# Patient Record
Sex: Female | Born: 1980 | Race: White | Hispanic: No | Marital: Single | Smoking: Never smoker
Health system: Southern US, Community
[De-identification: ages and names within clinical notes are randomized; demographics above are authoritative.]

## PROBLEM LIST (undated history)

## (undated) DIAGNOSIS — G43909 Migraine, unspecified, not intractable, without status migrainosus: Secondary | ICD-10-CM

## (undated) DIAGNOSIS — R87629 Unspecified abnormal cytological findings in specimens from vagina: Secondary | ICD-10-CM

## (undated) DIAGNOSIS — J302 Other seasonal allergic rhinitis: Secondary | ICD-10-CM

## (undated) DIAGNOSIS — M545 Low back pain, unspecified: Secondary | ICD-10-CM

## (undated) DIAGNOSIS — N159 Renal tubulo-interstitial disease, unspecified: Secondary | ICD-10-CM

## (undated) DIAGNOSIS — F1910 Other psychoactive substance abuse, uncomplicated: Secondary | ICD-10-CM

## (undated) DIAGNOSIS — R51 Headache: Secondary | ICD-10-CM

## (undated) HISTORY — PX: FOOT ARTHRODESIS, MODIFIED MCBRIDE: SUR52

## (undated) HISTORY — DX: Migraine, unspecified, not intractable, without status migrainosus: G43.909

## (undated) HISTORY — PX: OTHER SURGICAL HISTORY: SHX169

## (undated) HISTORY — DX: Other psychoactive substance abuse, uncomplicated: F19.10

## (undated) HISTORY — DX: Unspecified abnormal cytological findings in specimens from vagina: R87.629

---
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
## 2002-01-19 ENCOUNTER — Inpatient Hospital Stay (HOSPITAL_COMMUNITY): Admission: AD | Admit: 2002-01-19 | Discharge: 2002-01-19 | Payer: Self-pay | Admitting: Obstetrics and Gynecology

## 2002-01-24 ENCOUNTER — Inpatient Hospital Stay (HOSPITAL_COMMUNITY): Admission: AD | Admit: 2002-01-24 | Discharge: 2002-01-26 | Payer: Self-pay | Admitting: Obstetrics and Gynecology

## 2003-03-08 ENCOUNTER — Emergency Department (HOSPITAL_COMMUNITY): Admission: EM | Admit: 2003-03-08 | Discharge: 2003-03-08 | Payer: Self-pay | Admitting: Emergency Medicine

## 2004-02-20 DIAGNOSIS — N159 Renal tubulo-interstitial disease, unspecified: Secondary | ICD-10-CM

## 2004-02-20 HISTORY — DX: Renal tubulo-interstitial disease, unspecified: N15.9

## 2009-08-18 ENCOUNTER — Emergency Department (HOSPITAL_COMMUNITY): Admission: EM | Admit: 2009-08-18 | Discharge: 2009-08-18 | Payer: Self-pay | Admitting: Emergency Medicine

## 2010-02-19 NOTE — L&D Delivery Note (Signed)
Delivery Note At 2:52 AM a healthy female was delivered via  (Presentation: occiput anterior).  APGAR: 8, 9 ; weight: 8 lb 0 oz.  Placenta status: delivered intact.  Cord: 3 vessel with the following complications: Nuchal cord x1, delivered through  Anesthesia:  Epidural Episiotomy: None Lacerations: None requiring repair Est. Blood Loss (mL): <500 mL  Mom to postpartum.  Baby to nursery-stable.  Case supervised by Maylon Cos, CNM  Sandra Oliver 02/15/2011, 5:20 AM

## 2010-06-29 LAB — ABO/RH: RH Type: POSITIVE

## 2010-06-29 LAB — HEPATITIS B SURFACE ANTIGEN: Hepatitis B Surface Ag: NEGATIVE

## 2010-06-29 LAB — CBC
Hemoglobin: 13.4 g/dL (ref 12.0–16.0)
Platelets: 280 10*3/uL (ref 150–399)

## 2010-06-29 LAB — GC/CHLAMYDIA PROBE AMP, GENITAL: Chlamydia: NEGATIVE

## 2010-06-29 LAB — ANTIBODY SCREEN: Antibody Screen: NEGATIVE

## 2010-07-07 NOTE — Discharge Summary (Signed)
NAME:  ADARIA, HOLE                          ACCOUNT NO.:  [id number]   MEDICAL RECORD NO.:  [id number]                   PATIENT TYPE:  INP   LOCATION:  9102                                 FACILITY:  [facility]   PHYSICIAN:  Malachi Pro. Ambrose Mantle, M.D.              DATE OF BIRTH:  1980-03-03   DATE OF ADMISSION:  01/24/2002  DATE OF DISCHARGE:  01/26/2002                                 DISCHARGE SUMMARY   HISTORY OF PRESENT ILLNESS:  This is a 30 year old white female para 0,  gravida 1, last period March 31, 2001, [facility] January 05, 2002 by dates,  but December 3 by ultrasound admitted with rupture of membranes at 11:30  a.m.  Blood group and type O+ with a negative antibody.  Nonreactive  serology.  Rubella immune.  Hepatitis B surface antigen negative.  HIV  negative.  GC and Chlamydia negative.  Triple screen normal.  Group B Strep  negative.  Pap smear low grade SIL.  Vaginal ultrasound Jun 24, 2001:  Crown  rump length 2.48 cm, 9 weeks 1 day, [facility] January 26, 2002.  Colposcopy was  normal.  Repeat ultrasound average gestational age [redacted] weeks 5 days, [facility]  January 23, 2002.  The patient had a benign prenatal course.  She had  spontaneous rupture of membranes on the day of admission 11:30 a.m.   ALLERGIES:  No known allergies.   PAST MEDICAL HISTORY:  Usual childhood diseases plus urinary tract  infections.   OPERATIONS:  In 2000 she had a spur removed from her left foot.   FAMILY HISTORY:  Paternal grandmother with heart disease and diabetes.   PHYSICAL EXAMINATION:  VITAL SIGNS:  Blood pressure 140/77, pulse 82.  ABDOMEN:  Soft.  Fundal height 37 cm.  Fetal heart tones normal.  Contractions every four minutes.  PELVIC:  Cervix 3 cm, 80%, vertex at a -2.  There was clear fluid in the  vagina.   HOSPITAL COURSE:  The patient was begun on Pitocin and received an epidural  at approximately 7 p.m.  She began having variable decelerations with  contractions with good recovery.  By  9:12 p.m. the Pitocin was at 10  milliunits/minute.  Cervix 7 cm, 100%, vertex at a -1.  She became fully  dilated at 2241 hours.  Fetal heart rate was very sensitive to any position  change or any pushing efforts.  Pitocin was discontinued at 2152 hours  because of a three minute deceleration.  Oxygen was begun.  I asked the  patient to push once but the fetal heart rate decelerated markedly.  By  11:10 p.m. I wanted to observe to see if descent occurred without pushing.  The cervix was 10 cm and vertex at +1.  With the patient on her right side  she began to feel an urge to push and with pushing she brought the vertex to  the perineum.  Fetal heart rate remained acceptable.  The fetal heart rate  decelerations continued to occur occasionally so at 1 a.m. the epidural was  boosted.  Because the patient could not push the baby out, a KIWI vacuum  with the mushroom cup was applied with a silver dollar of caput showing.  With two contractions little progress was made.  A left mediolateral  episiotomy was cut and with the third contraction the living female infant 7  pounds 3 ounces with Apgars of 9 at one and 9 at five minutes was delivered  easily.  There was an occult cord prolapse.  There was mild shoulder  dystocia managed with McRoberts and wood screw maneuvers.  Placenta was  intact.  Uterus normal.  Rectal negative.  Left mediolateral episiotomy  repaired with 3-0 Vicryl.  Blood loss about 400 cc.  Postpartum the patient  did well and was discharged on the first postpartum day.  Hemoglobin on  admission 13.3, hematocrit 39.0, white count 10,400, platelet count 228,000.  Follow-up hemoglobin 10.9, hematocrit 32.5.  RPR was nonreactive.   FINAL DIAGNOSES:  1. Intrauterine pregnancy at 39+ weeks delivered vertex.  2. Prolonged second stage of labor.  3. Mild shoulder dystocia.   OPERATION:  1. Vacuum assisted vaginal delivery.  2. Left mediolateral episiotomy and repair.    CONDITION ON DISCHARGE:  Improved.   DISCHARGE INSTRUCTIONS:  Regular discharge instruction booklet.  Percocet  5/325 16 tablets one q.4-6h. as needed for pain.  The patient is advised to  return to the office in six weeks for follow-up examination.                                               Malachi Pro. Ambrose Mantle, M.D.    TFH/MEDQ  D:  01/26/2002  T:  01/26/2002  Job:  [id number]

## 2010-07-07 NOTE — Discharge Summary (Signed)
NAME:  Sandra Oliver, Sandra Oliver                          ACCOUNT NO.:  [id number]   MEDICAL RECORD NO.:  [id number]                   PATIENT TYPE:  INP   LOCATION:  9116                                 [facility]ILITY:  [facility]   PHYSICIAN:  Zenaida Niece, M.D.             DATE OF BIRTH:  02/07/1981   DATE OF ADMISSION:  09/13/2001  DATE OF DISCHARGE:  09/15/2001                                 DISCHARGE SUMMARY   ADMISSION DIAGNOSES:  1. Intrauterine pregnancy at 20 weeks.  2. Right lower quadrant pain.   DISCHARGE DIAGNOSES:  1. Intrauterine pregnancy at 20 weeks.  2. Right lower quadrant pain.   CONSULTATIONS:  Dr. Cyndia Bent and Dr. Shelle Iron.   PROCEDURES:  1. CT scan of the abdomen and pelvis.  2. CT scan of the chest.  3. Ultrasound of the pelvis and abdomen.  4. Lower extremity Dopplers.   HISTORY OF PRESENT ILLNESS:  This is an 30 year old white female gravida 1,  para 0 with an EGA of [redacted] weeks who was well until 14:00 on July 26.  She was  eating a chicken sandwich and french fries when she suddenly lost her  appetite.  She had no nausea or vomiting.  About one hour later she began  having right lateral midabdominal pain that progressed to the right upper  quadrant and also radiated to her right shoulder and right neck.  At  approximately 19:30 she ate some chips, and the pain moved to her lower  abdomen, but also involved the right upper quadrant, right shoulder, and  neck.  Again, no nausea, vomiting, or diarrhea.  She got lightheaded when  she stood up and decided to come to the hospital.  Past medical history:  Negative.  Past surgical history:  Foot surgery.  All remainder of her  history is negative.  Medications:  None.   PHYSICAL EXAMINATION:  VITAL SIGNS:  Temperature 99.4, pulse 89,  respirations 20, blood pressure 134/85, 128/73, 131/68, 124/54.  She was not  orthostatic.  HEENT:  Normal.  HEART:  Normal sinus rhythm without murmur.  CHEST:  Lungs clear to  auscultation and percussion.  ABDOMEN:  Some direct  tenderness in the right upper quadrant, left upper quadrant, and right lower  quadrant with slight rebound in the right lower quadrant with no CVA  tenderness.   HOSPITAL COURSE:  The patient was admitted to [facility] with a  diagnosis of intrauterine pregnancy at 20 weeks and abdominal pain  suspicious for appendicitis.  Dr. Cyndia Bent was consulted and advised  to admit her and observe her.  Admission labs revealed a white count of  13.4, hemoglobin 12.1, platelet count 228,000, normal chem panel, normal  amylase, normal lipase, normal urinalysis.  Dr. Jamey Ripa saw the patient on  July 27 and advised a CT scan.  The CT scan revealed no appendicitis and was  otherwise normal.  Pelvic ultrasound  was normal, as well as abdominal  ultrasound.  CT scan did reveal some right hydronephrosis, which is probably  just related to being pregnant.  Chest x-ray was also negative.   White blood cell count fell to 10.3 on July 27 and 6.9 on July 28.  Hemoglobin remained stable.  Platelet count remained stable.  On the morning  of July 28 she was still complaining of pain on the right side and right  neck when breathing deeply.  Her abdominal exam was unchanged.  Dr. Malachi Pro. Henley ordered a pulmonary consult.  Due to pleuritic quality of the  chest pain, a spiral CT scan of her chest was obtained, which did not reveal  a pulmonary embolism.  Lower extremity Dopplers were also normal.  On the  evening of July 28 her pain was stable, and she was felt to have an adequate  workup, which did not reveal an etiology for her pain.  However, at this  point there was felt to be no serious problem occurring, and she was felt to  be stable enough to be managed as an outpatient.   DISCHARGE INSTRUCTIONS:  Diet is regular diet.  Activity is as tolerated.  Medications are Darvocet-N 100 #20 one q.6h. p.r.n.  The patient is to call  for increase in  pain, decreased fetal movement, vaginal bleeding or gush of  fluid, and she is to followup in three to four days.                                                Zenaida Niece, M.D.    TDM/MEDQ  D:  09/15/2001  T:  09/22/2001  Job:  [id number]   cc:   Zenaida Niece, M.D.   Currie Paris, M.D.  Fax: [phone]   Barbaraann Share, M.D. [facility]

## 2010-07-07 NOTE — H&P (Signed)
[facility]  Patient:    Sandra Oliver, Sandra Oliver Visit Number: [id number] MRN: [id number]          Service Type: Attending:  Malachi Pro. Ambrose Mantle, M.D. Dictated by:   Malachi Pro. Ambrose Mantle, M.D. Adm. Date:  09/13/01   CC:         Currie Paris, M.D.   History and Physical  HISTORY OF PRESENT ILLNESS:   An 30 year old white single female para 0 gravida 1; last period March 2003; [facility] January 26, 2002.  The patient had ultrasounds at five to six weeks and at nine weeks to determine the [facility].  She was well until 2 p.m. on the day of admission.  She was eating a chicken sandwich and french fries when she lost her appetite.  She had no nausea or vomiting.  About one hour later she began having right lateral mid abdominal pain that progressed to the right upper quadrant, right shoulder, and right neck.  At approximately 7:30 p.m. she ate some chips and the pain moved to her lower abdomen but also involved her right upper quadrant, right shoulder, and neck.  She has had no nausea, vomiting, or diarrhea.  She had a last bowel movement at 4 p.m. on the day of admission.  She has not been constipated. She also reports that she got light-headed when she stood up.  PAST MEDICAL HISTORY:  ALLERGIES:                    No known allergies.  SURGICAL HISTORY:             Foot surgery in 2000.  MEDICAL HISTORY:              Usual childhood diseases.  FAMILY HISTORY:               Mother, 26, with asthma.  Father, 87, living and well.  One brother with bronchial asthma.  ALCOHOL, TOBACCO, AND DRUGS:  None.  SOCIAL HISTORY:               The patient is a high Garment/textile technologist.  She is presently working at [employer].  REVIEW OF SYSTEMS:            Negative.  PHYSICAL EXAMINATION ON ADMISSION:  VITAL SIGNS:                  Temperature 99.4, pulse 89, respirations 20, blood pressure 130/85.  Because of her history of getting light-headed when she stood up, her blood  pressure was taken lying down, sitting up, and standing up.  They were 128/73, 131/68, and 124/54 respectively.  Pulses were 88, 92, and 92.  HEENT:                        Revealed no cranial abnormalities.  NECK:                         Supple without thyromegaly.  HEART:                        Normal size and sounds, no murmurs.  LUNGS:                        Clear to P&A.  BREASTS:  Soft without masses.  ABDOMEN:                      Soft.  Fundal height at the umbilicus.  There was some direct tenderness in the right upper quadrant, left upper quadrant, and right lower quadrant.  There was slight rebound in the right lower quadrant.  There was no CVA tenderness.  PELVIC:                       Deferred on admission but repeated later and it showed a gravid uterus with no adnexal masses palpable.  DISPOSITION:                  Admitting impression was intrauterine pregnancy at 20 weeks with abdominal pain, possible appendicitis.  Dr. Jamey Ripa was consulted.  He advised admitting the patient and reexamining her in six to eight hours, which we did.  Her initial hemoglobin was 12.1; hematocrit 34.1; white count 13,400; platelet count 228,000.  Seven hours later her hemoglobin was 11.6; hematocrit 33.3; white count 10,300; platelet count 209,000. Comprehensive metabolic profile was normal except for a sodium of 134. Amylase was 39, lipase 22.  Urinalysis was completely negative.  Chest x-ray was normal.  The patient had an O2 saturation of 97-99 on room air.  ADMITTING IMPRESSION:         Intrauterine pregnancy at 20 weeks with abdominal pain.  The patient will be reexamined and possible CT scan on the day following admission.Dictated by:   Malachi Pro. Ambrose Mantle, M.D. Attending:  Malachi Pro. Ambrose Mantle, M.D. DD:  09/14/01 TD:  09/14/01 Job: [id number]

## 2010-11-14 ENCOUNTER — Encounter (HOSPITAL_COMMUNITY): Payer: Self-pay

## 2010-11-14 ENCOUNTER — Emergency Department (HOSPITAL_COMMUNITY)
Admission: EM | Admit: 2010-11-14 | Discharge: 2010-11-14 | Disposition: A | Discharge status: Home / Self Care | Payer: Medicaid Other | Attending: Emergency Medicine | Admitting: Emergency Medicine

## 2010-11-14 DIAGNOSIS — R109 Unspecified abdominal pain: Secondary | ICD-10-CM | POA: Insufficient documentation

## 2010-11-14 DIAGNOSIS — O99891 Other specified diseases and conditions complicating pregnancy: Secondary | ICD-10-CM | POA: Insufficient documentation

## 2010-11-14 LAB — URINE MICROSCOPIC-ADD ON

## 2010-11-14 LAB — URINALYSIS, ROUTINE W REFLEX MICROSCOPIC
Bilirubin Urine: NEGATIVE
Glucose, UA: NEGATIVE mg/dL
Hgb urine dipstick: NEGATIVE
Ketones, ur: NEGATIVE mg/dL
Nitrite: NEGATIVE
Protein, ur: NEGATIVE mg/dL
Specific Gravity, Urine: 1.019 (ref 1.005–1.030)
Urobilinogen, UA: 1 mg/dL (ref 0.0–1.0)
pH: 7 (ref 5.0–8.0)

## 2010-11-14 NOTE — Progress Notes (Signed)
Called received at 1035.  Arrived to [facility] ED Room 16 at 1049.  Pt resting in stretcher with no apparent distress.  Pt is a G3P2 with a reported EDC of 02/08/11.  GA 27+5.  NKDA.  NKFA.  No latex allergies.  Med hx: benign with the exception of a h/o of frequent UTIs, particularly during childhood and occasional migraines.  Pt reports that she was diagnosed with a left ovarian cyst during this pregnancy.  Surgical hx: Pt reports a bunionectomy in 2012.  Ob hx:  SVD x 2 without complications.  Pt reports fetus was dx with a "braibn cyst" at her anatomy scan on 09/07/10 that needed follow up at 28 weeks.  Pt unable to be more specific about US findings.  Meds:  PNV 1 tab PO daily.  Pt reports last dose 11/13/10 and Tylenol 650-1,000 mg PO PRN for HA.  Pt states last dose "a couple of weeks ago".  Pt presents to [facility] ED with c/o constant left sided lower abdominal pain since awakening on Saturday 11/11/10.  Pt reports the pain to be constant in nature and dull.  Pt states the constant dull pain becomes sharp with certain positions and movements but quickly subsides back to a dull ache.  Pt denies any abdominal cramping, tightening, or UCs.  Pt denies both LOF and vaginal bleeding.  Pt states she moved back to [place] in July.  She states she received [facility] in [place] up until late July 2012.  Pt states she has not been able to obtain [facility] since her relocation to [place].  Pt reports 20 week anatomy scan revealed a "brain cyst" on the fetus and a left ovarian cyst of unknown size.  Pt unable to elaborate upon US findings but states f/u was suggested for [redacted] weeks GA.  Difficulty tracing FHR d/t maternal habitus.  Doppler tones assessed x 3 min upon arrival.  FHTs 140-150 with no decelerations noted.  Fetal movement both palpable and audible with doppler.  Pt reports normal fetal movement.  Abdomen soft and nontender upon palpation with no UCs noted.  No evidence of LOF or vaginal bleeding noted upon RN assessment.  VSS.    Dr Juliene Pina  informed of pt's presence, status, admitting complaint, med hx, ob hx, FHTs, absence of UCs.

## 2010-11-14 NOTE — Progress Notes (Signed)
FHT reassuring and appropriate for GA.  No UCs noted.  EFM/toco dc'd at this time.

## 2010-11-14 NOTE — Progress Notes (Signed)
Results for Sandra Oliver, HOH (MRN [id number]) as of 11/14/2010 11:55  Ref. Range 11/14/2010 10:27  Color, Urine Latest Range: YELLOW  YELLOW  Appearance Latest Range: CLEAR  CLOUDY (A)  Specific Gravity, Urine Latest Range: 1.005-1.030  1.019  pH Latest Range: 5.0-8.0  7.0  Glucose, UA Latest Range: NEGATIVE mg/dL NEGATIVE  Bilirubin Urine Latest Range: NEGATIVE  NEGATIVE  Ketones, ur Latest Range: NEGATIVE mg/dL NEGATIVE  Protein Latest Range: NEGATIVE mg/dL NEGATIVE  Urobilinogen, UA Latest Range: 0.0-1.0 mg/dL 1.0  Nitrite Latest Range: NEGATIVE  NEGATIVE  Leukocytes, UA Latest Range: NEGATIVE  TRACE (A)  Urine-Other No range found MUCOUS PRESENT  WBC, UA Latest Range: <3 WBC/hpf 0-2  RBC / HPF Latest Range: <3 RBC/hpf 0-2  Squamous Epithelial / LPF Latest Range: RARE  FEW (A)  Bacteria, UA Latest Range: RARE  RARE  Casts Latest Range: NEGATIVE  GRANULAR CAST (A)  Hgb urine dipstick Latest Range: NEGATIVE  NEGATIVE

## 2010-11-14 NOTE — Progress Notes (Signed)
EFM successfuly applied and tracing after several minutes of continual RN presence at [facility], attempting to obtain tracing.  FHTs 150's.  Will continue to assess.

## 2010-11-14 NOTE — Progress Notes (Signed)
Dr Juliene Pina updated re pt status, FHT, UA results.  Pt cleared from OB standpoint by Dr Juliene Pina at this time.  Pt given information regarding obtaining PNC and its importance to include a list of OBGYN providers and numbers and Women's clinic info at [facility].  Plan of care discussed with pt at this time.  Pt's questions answered in full and to her verbalized understanding.  Pt verbalizes understanding of and agreement with her care plan and intent to seek [facility].   PTL precautions reviewed with pt at this time.  Pt encouraged to go to [facility] to be evaluated should she experience any s/s of PTL, abdominal cramping, uterine contractions, vaginal bleeding, any leaking of fluid, or decreased fetal movement.  Pt verbalizes understanding of and agreement with both her dc instructions and her plan of care.

## 2010-11-15 LAB — URINE CULTURE: Culture: NO GROWTH

## 2010-12-06 ENCOUNTER — Other Ambulatory Visit: Payer: Self-pay | Admitting: Obstetrics and Gynecology

## 2010-12-06 ENCOUNTER — Ambulatory Visit (INDEPENDENT_AMBULATORY_CARE_PROVIDER_SITE_OTHER): Payer: Self-pay | Admitting: Obstetrics and Gynecology

## 2010-12-06 VITALS — BP 113/69 | Temp 97.8°F | Ht 62.5 in | Wt 200.8 lb

## 2010-12-06 DIAGNOSIS — R81 Glycosuria: Secondary | ICD-10-CM | POA: Insufficient documentation

## 2010-12-06 DIAGNOSIS — Z3480 Encounter for supervision of other normal pregnancy, unspecified trimester: Secondary | ICD-10-CM | POA: Insufficient documentation

## 2010-12-06 DIAGNOSIS — IMO0002 Reserved for concepts with insufficient information to code with codable children: Secondary | ICD-10-CM

## 2010-12-06 DIAGNOSIS — Z23 Encounter for immunization: Secondary | ICD-10-CM

## 2010-12-06 DIAGNOSIS — O350XX0 Maternal care for (suspected) central nervous system malformation in fetus, not applicable or unspecified: Secondary | ICD-10-CM

## 2010-12-06 DIAGNOSIS — Z3490 Encounter for supervision of normal pregnancy, unspecified, unspecified trimester: Secondary | ICD-10-CM

## 2010-12-06 DIAGNOSIS — O99330 Smoking (tobacco) complicating pregnancy, unspecified trimester: Secondary | ICD-10-CM | POA: Insufficient documentation

## 2010-12-06 DIAGNOSIS — R87619 Unspecified abnormal cytological findings in specimens from cervix uteri: Secondary | ICD-10-CM

## 2010-12-06 LAB — POCT URINALYSIS DIP (DEVICE)
Glucose, UA: 100 mg/dL — AB
Ketones, ur: NEGATIVE mg/dL
Leukocytes, UA: NEGATIVE
Protein, ur: NEGATIVE mg/dL
Specific Gravity, Urine: >=1.03 (ref 1.005–1.030)

## 2010-12-06 LAB — RPR

## 2010-12-06 MED ORDER — INFLUENZA VIRUS VACC SPLIT PF IM SUSP: 0.5000 mL | Freq: Once | INTRAMUSCULAR | Status: DC

## 2010-12-06 NOTE — Progress Notes (Signed)
S: 30 yo G3P2002 at 31.1wk had limited PNC in [facility] notable for [facility] on [place] which she is concerned about, abnl Pap for which plan was colpo PP, smoker. Hx neg for GDM. Seen [facility] 11/14/10 for abd pain which has resolved. Reviewed hx from today. Outside records pending.   O: glucosuria 100 and S>D. 1 hr glucola in progress. A:P: Discussed smoking cessation, F/U US for ? Hx CPC and S>D. GC/CT RPR done. Flu shot today.

## 2010-12-06 NOTE — Patient Instructions (Signed)
Pregnancy - Third Trimester The third trimester of pregnancy (the last 3 months) is a period of the most rapid growth for you and your baby. The baby approaches a length of 20 inches and a weight of 6 to 10 pounds. The baby is adding on fat and getting ready for life outside your body. While inside, babies have periods of sleeping and waking, suck their thumbs, and hiccups. You can often feel small contractions of the uterus. This is false labor. It is also called Braxton-Hicks contractions. This is like a practice for labor. The usual problems in this stage of pregnancy include more difficulty breathing, swelling of the hands and feet from water retention, and having to urinate more often because of the uterus and baby pressing on your bladder.  PRENATAL EXAMS  Blood work may continue to be done during prenatal exams. These tests are done to check on your health and the probable health of your baby. Blood work is used to follow your blood levels (hemoglobin). Anemia (low hemoglobin) is common during pregnancy. Iron and vitamins are given to help prevent this. You may also continue to be checked for diabetes. Some of the past blood tests may be done again.   The size of the uterus is measured during each visit. This makes sure your baby is growing properly according to your pregnancy dates.   Your blood pressure is checked every prenatal visit. This is to make sure you are not getting toxemia.   Your urine is checked every prenatal visit for infection, diabetes and protein.   Your weight is checked at each visit. This is done to make sure gains are happening at the suggested rate and that you and your baby are growing normally.   Sometimes, an ultrasound is performed to confirm the position and the proper growth and development of the baby. This is a test done that bounces harmless sound waves off the baby so your caregiver can more accurately determine due dates.   Discuss the type of pain  medication and anesthesia you will have during your labor and delivery.   Discuss the possibility and anesthesia if a Cesarean Section might be necessary.   Inform your caregiver if there is any mental or physical violence at home.  Sometimes, a specialized non-stress test, contraction stress test and biophysical profile are done to make sure the baby is not having a problem. Checking the amniotic fluid surrounding the baby is called an amniocentesis. The amniotic fluid is removed by sticking a needle into the belly (abdomen). This is sometimes done near the end of pregnancy if an early delivery is required. In this case, it is done to help make sure the baby's lungs are mature enough for the baby to live outside of the womb. If the lungs are not mature and it is unsafe to deliver the baby, an injection of cortisone medication is given to the mother 1 to 2 days before the delivery. This helps the baby's lungs mature and makes it safer to deliver the baby. CHANGES OCCURING IN THE THIRD TRIMESTER OF PREGNANCY Your body goes through many changes during pregnancy. They vary from person to person. Talk to your caregiver about changes you notice and are concerned about.  During the last trimester, you have probably had an increase in your appetite. It is normal to have cravings for certain foods. This varies from person to person and pregnancy to pregnancy.   You may begin to get stretch marks on your hips,   abdomen, and breasts. These are normal changes in the body during pregnancy. There are no exercises or medications to take which prevent this change.   Constipation may be treated with a stool softener or adding bulk to your diet. Drinking lots of fluids, fiber in vegetables, fruits, and whole grains are helpful.   Exercising is also helpful. If you have been very active up until your pregnancy, most of these activities can be continued during your pregnancy. If you have been less active, it is helpful  to start an exercise program such as walking. Consult your caregiver before starting exercise programs.   Avoid all smoking, alcohol, un-prescribed drugs, herbs and "street drugs" during your pregnancy. These chemicals affect the formation and growth of the baby. Avoid chemicals throughout the pregnancy to ensure the delivery of a healthy infant.   Backache, varicose veins and hemorrhoids may develop or get worse.   You will tire more easily in the third trimester, which is normal.   The baby's movements may be stronger and more often.   You may become short of breath easily.   Your belly button may stick out.   A yellow discharge may leak from your breasts called colostrum.   You may have a bloody mucus discharge. This usually occurs a few days to a week before labor begins.  HOME CARE INSTRUCTIONS  Keep your caregiver's appointments. Follow your caregiver's instructions regarding medication use, exercise, and diet.   During pregnancy, you are providing food for you and your baby. Continue to eat regular, well-balanced meals. Choose foods such as meat, fish, milk and other low fat dairy products, vegetables, fruits, and whole-grain breads and cereals. Your caregiver will tell you of the ideal weight gain.   A physical sexual relationship may be continued throughout pregnancy if there are no other problems such as early (premature) leaking of amniotic fluid from the membranes, vaginal bleeding, or belly (abdominal) pain.   Exercise regularly if there are no restrictions. Check with your caregiver if you are unsure of the safety of your exercises. Greater weight gain will occur in the last 2 trimesters of pregnancy. Exercising helps:   Control your weight.   Get you in shape for labor and delivery.   You lose weight after you deliver.   Rest a lot with legs elevated, or as needed for leg cramps or low back pain.   Wear a good support or jogging bra for breast tenderness during  pregnancy. This may help if worn during sleep. Pads or tissues may be used in the bra if you are leaking colostrum.   Do not use hot tubs, steam rooms, or saunas.   Wear your seat belt when driving. This protects you and your baby if you are in an accident.   Avoid raw meat, cat litter boxes and soil used by cats. These carry germs that can cause birth defects in the baby.   It is easier to loose urine during pregnancy. Tightening up and strengthening the pelvic muscles will help with this problem. You can practice stopping your urination while you are going to the bathroom. These are the same muscles you need to strengthen. It is also the muscles you would use if you were trying to stop from passing gas. You can practice tightening these muscles up 10 times a set and repeating this about 3 times per day. Once you know what muscles to tighten up, do not perform these exercises during urination. It is more likely to   cause an infection by backing up the urine.   Ask for help if you have financial, counseling or nutritional needs during pregnancy. Your caregiver will be able to offer counseling for these needs as well as refer you for other special needs.   Make a list of emergency phone numbers and have them available.   Plan on getting help from family or friends when you go home from the hospital.   Make a trial run to the hospital.   Take prenatal classes with the father to understand, practice and ask questions about the labor and delivery.   Prepare the baby's room/nursery.   Do not travel out of the city unless it is absolutely necessary and with the advice of your caregiver.   Wear only low or no heal shoes to have better balance and prevent falling.  MEDICATIONS AND DRUG USE IN PREGNANCY  Take prenatal vitamins as directed. The vitamin should contain 1 milligram of folic acid. Keep all vitamins out of reach of children. Only a couple vitamins or tablets containing iron may be fatal  to a baby or young child when ingested.   Avoid use of all medications, including herbs, over-the-counter medications, not prescribed or suggested by your caregiver. Only take over-the-counter or prescription medicines for pain, discomfort, or fever as directed by your caregiver. Do not use aspirin, ibuprofen (Motrin, Advil, Nuprin) or naproxen (Aleve) unless OK'd by your caregiver.   Let your caregiver also know about herbs you may be using.   Alcohol is related to a number of birth defects. This includes fetal alcohol syndrome. All alcohol, in any form, should be avoided completely. Smoking will cause low birth rate and premature babies.   Street/illegal drugs are very harmful to the baby. They are absolutely forbidden. A baby born to an addicted mother will be addicted at birth. The baby will go through the same withdrawal an adult does.  SEEK MEDICAL CARE IF  You have any concerns or worries during your pregnancy. It is better to call with your questions if you feel they cannot wait, rather than worry about them.  DECISIONS ABOUT CIRCUMCISION You may or may not know the sex of your baby. If you know your baby is a boy, it may be time to think about circumcision. Circumcision is the removal of the foreskin of the penis. This is the skin that covers the sensitive end of the penis. There is no proven medical need for this. Often this decision is made on what is popular at the time or based upon religious beliefs and social issues. You can discuss these issues with your caregiver or pediatrician. SEEK IMMEDIATE MEDICAL CARE IF:  An unexplained oral temperature above 100 develops, or as your caregiver suggests.   You have leaking of fluid from the vagina (birth canal). If leaking membranes are suspected, take your temperature and tell your caregiver of this when you call.   There is vaginal spotting, bleeding or passing clots. Tell your caregiver of the amount and how many pads are used.    You develop a bad smelling vaginal discharge with a change in the color from clear to white.   You develop vomiting that lasts more than 24 hours.   You develop chills or fever.   You develop shortness of breath.   You develop burning on urination.   You loose more than 2 pounds of weight or gain more than 2 pounds of weight or as suggested by your caregiver.     You notice sudden swelling of your face, hands, and feet or legs.   You develop belly (abdominal) pain. Round ligament discomfort is a common non-cancerous (benign) cause of abdominal pain in pregnancy. Your caregiver still must evaluate you.   You develop a severe headache that does not go away.   You develop visual problems, blurred or double vision.   If you have not felt your baby move for more than 1 hour. If you think the baby is not moving as much as usual, eat something with sugar in it and lie down on your left side for an hour. The baby should move at least 4 to 5 times per hour. Call right away if your baby moves less than that.   You fall, are in a car accident or any kind of trauma.   There is mental or physical violence at home.  Document Released: 01/30/2001 Document Re-Released: 12/03/2008 [employer] Patient Information 2011 [employer]. 

## 2010-12-06 NOTE — Progress Notes (Signed)
Edema: Right Leg

## 2010-12-07 ENCOUNTER — Ambulatory Visit (HOSPITAL_COMMUNITY)
Admission: RE | Admit: 2010-12-07 | Discharge: 2010-12-07 | Disposition: A | Discharge status: Home / Self Care | Payer: Medicaid Other | Source: Ambulatory Visit | Attending: Obstetrics and Gynecology | Admitting: Obstetrics and Gynecology

## 2010-12-07 ENCOUNTER — Other Ambulatory Visit: Payer: Self-pay | Admitting: Obstetrics and Gynecology

## 2010-12-07 DIAGNOSIS — O358XX0 Maternal care for other (suspected) fetal abnormality and damage, not applicable or unspecified: Secondary | ICD-10-CM | POA: Insufficient documentation

## 2010-12-07 DIAGNOSIS — IMO0002 Reserved for concepts with insufficient information to code with codable children: Secondary | ICD-10-CM

## 2010-12-07 DIAGNOSIS — Z1389 Encounter for screening for other disorder: Secondary | ICD-10-CM | POA: Insufficient documentation

## 2010-12-07 DIAGNOSIS — Z363 Encounter for antenatal screening for malformations: Secondary | ICD-10-CM | POA: Insufficient documentation

## 2010-12-07 LAB — CULTURE, OB URINE
Colony Count: NO GROWTH
Organism ID, Bacteria: NO GROWTH

## 2010-12-07 LAB — US OB COMP LESS 14 WKS

## 2010-12-07 LAB — GC/CHLAMYDIA PROBE AMP, GENITAL: Chlamydia, DNA Probe: NEGATIVE

## 2010-12-20 ENCOUNTER — Encounter: Payer: Self-pay | Admitting: Obstetrics and Gynecology

## 2010-12-20 ENCOUNTER — Ambulatory Visit (INDEPENDENT_AMBULATORY_CARE_PROVIDER_SITE_OTHER): Payer: Self-pay | Admitting: Obstetrics and Gynecology

## 2010-12-20 DIAGNOSIS — Z23 Encounter for immunization: Secondary | ICD-10-CM

## 2010-12-20 DIAGNOSIS — Z3480 Encounter for supervision of other normal pregnancy, unspecified trimester: Secondary | ICD-10-CM

## 2010-12-20 LAB — POCT URINALYSIS DIP (DEVICE)
Bilirubin Urine: NEGATIVE
Ketones, ur: NEGATIVE mg/dL
Nitrite: NEGATIVE
Protein, ur: NEGATIVE mg/dL
pH: 7 (ref 5.0–8.0)

## 2010-12-20 MED ORDER — TETANUS-DIPHTH-ACELL PERTUSSIS 5-2.5-18.5 LF-MCG/0.5 IM SUSP
0.5000 mL | Freq: Once | INTRAMUSCULAR | Status: AC
  Administered 2010-12-20: 0.5 mL via INTRAMUSCULAR

## 2010-12-20 NOTE — Patient Instructions (Signed)

## 2010-12-20 NOTE — Progress Notes (Signed)
Swelling in right leg only. Pelvic pressure and back pain since Sunday when walking. No vaginal discharge. Pulse 100. Pt signed consent for Tdap.

## 2010-12-20 NOTE — Progress Notes (Signed)
S; Rt LE swells more than left, noticeable when awakening, esp rt ankle. No focal pain or redness. This has been ongoing for weeks and was similar in prior pregnancy during which she had a neg Doppler study of that leg. Some groin and LBP with movement and walking. No UTI sx. O: LE: no varicosities or redness. 1-2+ ankle edema appears symmetrical at present. No calf tenderness.  [place]: 12/07/10: CPC 4mm on rt. 50th %ile growth with short femur, AFI 15. 1 hr glucola 96. Urine dip with lg LE and tr blood A:P: tdap today. Urine C&S and advised increase fluids. Discussed RLP. Informed of lab/US results.  AP: Small CPC.

## 2010-12-20 NOTE — Progress Notes (Signed)
Addended by: Toula Moos on: 12/20/2010 10:34 AM   Modules accepted: Orders

## 2011-01-03 ENCOUNTER — Ambulatory Visit (INDEPENDENT_AMBULATORY_CARE_PROVIDER_SITE_OTHER): Payer: Medicaid Other | Admitting: Advanced Practice Midwife

## 2011-01-03 ENCOUNTER — Other Ambulatory Visit: Payer: Self-pay

## 2011-01-03 VITALS — BP 116/68 | Temp 98.1°F | Wt 205.4 lb

## 2011-01-03 DIAGNOSIS — O99330 Smoking (tobacco) complicating pregnancy, unspecified trimester: Secondary | ICD-10-CM

## 2011-01-03 DIAGNOSIS — Z3490 Encounter for supervision of normal pregnancy, unspecified, unspecified trimester: Secondary | ICD-10-CM

## 2011-01-03 DIAGNOSIS — O09899 Supervision of other high risk pregnancies, unspecified trimester: Secondary | ICD-10-CM

## 2011-01-03 DIAGNOSIS — Z3480 Encounter for supervision of other normal pregnancy, unspecified trimester: Secondary | ICD-10-CM

## 2011-01-03 LAB — POCT URINALYSIS DIP (DEVICE)
Ketones, ur: NEGATIVE mg/dL
Protein, ur: NEGATIVE mg/dL
Specific Gravity, Urine: 1.025 (ref 1.005–1.030)
Urobilinogen, UA: 0.2 mg/dL (ref 0.0–1.0)
pH: 6 (ref 5.0–8.0)

## 2011-01-03 NOTE — Patient Instructions (Signed)
Pregnancy - Third Trimester The third trimester of pregnancy (the last 3 months) is a period of the most rapid growth for you and your baby. The baby approaches a length of 20 inches and a weight of 6 to 10 pounds. The baby is adding on fat and getting ready for life outside your body. While inside, babies have periods of sleeping and waking, suck their thumbs, and hiccups. You can often feel small contractions of the uterus. This is false labor. It is also called Braxton-Hicks contractions. This is like a practice for labor. The usual problems in this stage of pregnancy include more difficulty breathing, swelling of the hands and feet from water retention, and having to urinate more often because of the uterus and baby pressing on your bladder.  PRENATAL EXAMS  Blood work may continue to be done during prenatal exams. These tests are done to check on your health and the probable health of your baby. Blood work is used to follow your blood levels (hemoglobin). Anemia (low hemoglobin) is common during pregnancy. Iron and vitamins are given to help prevent this. You may also continue to be checked for diabetes. Some of the past blood tests may be done again.   The size of the uterus is measured during each visit. This makes sure your baby is growing properly according to your pregnancy dates.   Your blood pressure is checked every prenatal visit. This is to make sure you are not getting toxemia.   Your urine is checked every prenatal visit for infection, diabetes and protein.   Your weight is checked at each visit. This is done to make sure gains are happening at the suggested rate and that you and your baby are growing normally.   Sometimes, an ultrasound is performed to confirm the position and the proper growth and development of the baby. This is a test done that bounces harmless sound waves off the baby so your caregiver can more accurately determine due dates.   Discuss the type of pain  medication and anesthesia you will have during your labor and delivery.   Discuss the possibility and anesthesia if a Cesarean Section might be necessary.   Inform your caregiver if there is any mental or physical violence at home.  Sometimes, a specialized non-stress test, contraction stress test and biophysical profile are done to make sure the baby is not having a problem. Checking the amniotic fluid surrounding the baby is called an amniocentesis. The amniotic fluid is removed by sticking a needle into the belly (abdomen). This is sometimes done near the end of pregnancy if an early delivery is required. In this case, it is done to help make sure the baby's lungs are mature enough for the baby to live outside of the womb. If the lungs are not mature and it is unsafe to deliver the baby, an injection of cortisone medication is given to the mother 1 to 2 days before the delivery. This helps the baby's lungs mature and makes it safer to deliver the baby. CHANGES OCCURING IN THE THIRD TRIMESTER OF PREGNANCY Your body goes through many changes during pregnancy. They vary from person to person. Talk to your caregiver about changes you notice and are concerned about.  During the last trimester, you have probably had an increase in your appetite. It is normal to have cravings for certain foods. This varies from person to person and pregnancy to pregnancy.   You may begin to get stretch marks on your hips,   abdomen, and breasts. These are normal changes in the body during pregnancy. There are no exercises or medications to take which prevent this change.   Constipation may be treated with a stool softener or adding bulk to your diet. Drinking lots of fluids, fiber in vegetables, fruits, and whole grains are helpful.   Exercising is also helpful. If you have been very active up until your pregnancy, most of these activities can be continued during your pregnancy. If you have been less active, it is helpful  to start an exercise program such as walking. Consult your caregiver before starting exercise programs.   Avoid all smoking, alcohol, un-prescribed drugs, herbs and "street drugs" during your pregnancy. These chemicals affect the formation and growth of the baby. Avoid chemicals throughout the pregnancy to ensure the delivery of a healthy infant.   Backache, varicose veins and hemorrhoids may develop or get worse.   You will tire more easily in the third trimester, which is normal.   The baby's movements may be stronger and more often.   You may become short of breath easily.   Your belly button may stick out.   A yellow discharge may leak from your breasts called colostrum.   You may have a bloody mucus discharge. This usually occurs a few days to a week before labor begins.  HOME CARE INSTRUCTIONS   Keep your caregiver's appointments. Follow your caregiver's instructions regarding medication use, exercise, and diet.   During pregnancy, you are providing food for you and your baby. Continue to eat regular, well-balanced meals. Choose foods such as meat, fish, milk and other low fat dairy products, vegetables, fruits, and whole-grain breads and cereals. Your caregiver will tell you of the ideal weight gain.   A physical sexual relationship may be continued throughout pregnancy if there are no other problems such as early (premature) leaking of amniotic fluid from the membranes, vaginal bleeding, or belly (abdominal) pain.   Exercise regularly if there are no restrictions. Check with your caregiver if you are unsure of the safety of your exercises. Greater weight gain will occur in the last 2 trimesters of pregnancy. Exercising helps:   Control your weight.   Get you in shape for labor and delivery.   You lose weight after you deliver.   Rest a lot with legs elevated, or as needed for leg cramps or low back pain.   Wear a good support or jogging bra for breast tenderness during  pregnancy. This may help if worn during sleep. Pads or tissues may be used in the bra if you are leaking colostrum.   Do not use hot tubs, steam rooms, or saunas.   Wear your seat belt when driving. This protects you and your baby if you are in an accident.   Avoid raw meat, cat litter boxes and soil used by cats. These carry germs that can cause birth defects in the baby.   It is easier to loose urine during pregnancy. Tightening up and strengthening the pelvic muscles will help with this problem. You can practice stopping your urination while you are going to the bathroom. These are the same muscles you need to strengthen. It is also the muscles you would use if you were trying to stop from passing gas. You can practice tightening these muscles up 10 times a set and repeating this about 3 times per day. Once you know what muscles to tighten up, do not perform these exercises during urination. It is more likely   to cause an infection by backing up the urine.   Ask for help if you have financial, counseling or nutritional needs during pregnancy. Your caregiver will be able to offer counseling for these needs as well as refer you for other special needs.   Make a list of emergency phone numbers and have them available.   Plan on getting help from family or friends when you go home from the hospital.   Make a trial run to the hospital.   Take prenatal classes with the father to understand, practice and ask questions about the labor and delivery.   Prepare the baby's room/nursery.   Do not travel out of the city unless it is absolutely necessary and with the advice of your caregiver.   Wear only low or no heal shoes to have better balance and prevent falling.  MEDICATIONS AND DRUG USE IN PREGNANCY  Take prenatal vitamins as directed. The vitamin should contain 1 milligram of folic acid. Keep all vitamins out of reach of children. Only a couple vitamins or tablets containing iron may be fatal  to a baby or young child when ingested.   Avoid use of all medications, including herbs, over-the-counter medications, not prescribed or suggested by your caregiver. Only take over-the-counter or prescription medicines for pain, discomfort, or fever as directed by your caregiver. Do not use aspirin, ibuprofen (Motrin, Advil, Nuprin) or naproxen (Aleve) unless OK'd by your caregiver.   Let your caregiver also know about herbs you may be using.   Alcohol is related to a number of birth defects. This includes fetal alcohol syndrome. All alcohol, in any form, should be avoided completely. Smoking will cause low birth rate and premature babies.   Street/illegal drugs are very harmful to the baby. They are absolutely forbidden. A baby born to an addicted mother will be addicted at birth. The baby will go through the same withdrawal an adult does.  SEEK MEDICAL CARE IF: You have any concerns or worries during your pregnancy. It is better to call with your questions if you feel they cannot wait, rather than worry about them. DECISIONS ABOUT CIRCUMCISION You may or may not know the sex of your baby. If you know your baby is a boy, it may be time to think about circumcision. Circumcision is the removal of the foreskin of the penis. This is the skin that covers the sensitive end of the penis. There is no proven medical need for this. Often this decision is made on what is popular at the time or based upon religious beliefs and social issues. You can discuss these issues with your caregiver or pediatrician. SEEK IMMEDIATE MEDICAL CARE IF:   An unexplained oral temperature above 102 F (38.9 C) develops, or as your caregiver suggests.   You have leaking of fluid from the vagina (birth canal). If leaking membranes are suspected, take your temperature and tell your caregiver of this when you call.   There is vaginal spotting, bleeding or passing clots. Tell your caregiver of the amount and how many pads are  used.   You develop a bad smelling vaginal discharge with a change in the color from clear to white.   You develop vomiting that lasts more than 24 hours.   You develop chills or fever.   You develop shortness of breath.   You develop burning on urination.   You loose more than 2 pounds of weight or gain more than 2 pounds of weight or as suggested by your   caregiver.   You notice sudden swelling of your face, hands, and feet or legs.   You develop belly (abdominal) pain. Round ligament discomfort is a common non-cancerous (benign) cause of abdominal pain in pregnancy. Your caregiver still must evaluate you.   You develop a severe headache that does not go away.   You develop visual problems, blurred or double vision.   If you have not felt your baby move for more than 1 hour. If you think the baby is not moving as much as usual, eat something with sugar in it and lie down on your left side for an hour. The baby should move at least 4 to 5 times per hour. Call right away if your baby moves less than that.   You fall, are in a car accident or any kind of trauma.   There is mental or physical violence at home.  Document Released: 01/30/2001 Document Revised: 10/18/2010 Document Reviewed: 08/04/2008 [employer] Patient Information 2012 [employer]. Breastfeeding BENEFITS OF BREASTFEEDING For the baby  The first milk (colostrum) helps the baby's digestive system function better.   There are antibodies from the mother in the milk that help the baby fight off infections.   The baby has a lower incidence of asthma, allergies, and SIDS (sudden infant death syndrome).   The nutrients in breast milk are better than formulas for the baby and helps the baby's brain grow better.   Babies who breastfeed have less gas, colic, and constipation.  For the mother  Breastfeeding helps develop a very special bond between mother and baby.   It is more convenient, always available at the  correct temperature and cheaper than formula feeding.   It burns calories in the mother and helps with losing weight that was gained during pregnancy.   It makes the uterus contract back down to normal size faster and slows bleeding following delivery.   Breastfeeding mothers have a lower risk of developing breast cancer.  NURSE FREQUENTLY  A healthy, full-term baby may breastfeed as often as every hour or space his or her feedings to every 3 hours.   How often to nurse will vary from baby to baby. Watch your baby for signs of hunger, not the clock.   Nurse as often as the baby requests, or when you feel the need to reduce the fullness of your breasts.   Awaken the baby if it has been 3 to 4 hours since the last feeding.   Frequent feeding will help the mother make more milk and will prevent problems like sore nipples and engorgement of the breasts.  BABY'S POSITION AT THE BREAST  Whether lying down or sitting, be sure that the baby's tummy is facing your tummy.   Support the breast with 4 fingers underneath the breast and the thumb above. Make sure your fingers are well away from the nipple and baby's mouth.   Stroke the baby's lips and cheek closest to the breast gently with your finger or nipple.   When the baby's mouth is open wide enough, place all of your nipple and as much of the dark area around the nipple as possible into your baby's mouth.   Pull the baby in close so the tip of the nose and the baby's cheeks touch the breast during the feeding.  FEEDINGS  The length of each feeding varies from baby to baby and from feeding to feeding.   The baby must suck about 2 to 3 minutes for your milk   to get to him or her. This is called a "let down." For this reason, allow the baby to feed on each breast as long as he or she wants. Your baby will end the feeding when he or she has received the right balance of nutrients.   To break the suction, put your finger into the corner of the  baby's mouth and slide it between his or her gums before removing your breast from his or her mouth. This will help prevent sore nipples.  REDUCING BREAST ENGORGEMENT  In the first week after your baby is born, you may experience signs of breast engorgement. When breasts are engorged, they feel heavy, warm, full, and may be tender to the touch. You can reduce engorgement if you:   Nurse frequently, every 2 to 3 hours. Mothers who breastfeed early and often have fewer problems with engorgement.   Place light ice packs on your breasts between feedings. This reduces swelling. Wrap the ice packs in a lightweight towel to protect your skin.   Apply moist hot packs to your breast for 5 to 10 minutes before each feeding. This increases circulation and helps the milk flow.   Gently massage your breast before and during the feeding.   Make sure that the baby empties at least one breast at every feeding before switching sides.   Use a breast pump to empty the breasts if your baby is sleepy or not nursing well. You may also want to pump if you are returning to work or or you feel you are getting engorged.   Avoid bottle feeds, pacifiers or supplemental feedings of water or juice in place of breastfeeding.   Be sure the baby is latched on and positioned properly while breastfeeding.   Prevent fatigue, stress, and anemia.   Wear a supportive bra, avoiding underwire styles.   Eat a balanced diet with enough fluids.  If you follow these suggestions, your engorgement should improve in 24 to 48 hours. If you are still experiencing difficulty, call your lactation consultant or caregiver. IS MY BABY GETTING ENOUGH MILK? Sometimes, mothers worry about whether their babies are getting enough milk. You can be assured that your baby is getting enough milk if:  The baby is actively sucking and you hear swallowing.   The baby nurses at least 8 to 12 times in a 24 hour time period. Nurse your baby until he or  she unlatches or falls asleep at the first breast (at least 10 to 20 minutes), then offer the second side.   The baby is wetting 5 to 6 disposable diapers (6 to 8 cloth diapers) in a 24 hour period by 5 to 6 days of age.   The baby is having at least 2 to 3 stools every 24 hours for the first few months. Breast milk is all the food your baby needs. It is not necessary for your baby to have water or formula. In fact, to help your breasts make more milk, it is best not to give your baby supplemental feedings during the early weeks.   The stool should be soft and yellow.   The baby should gain 4 to 7 ounces per week after he is 4 days old.  TAKE CARE OF YOURSELF Take care of your breasts by:  Bathing or showering daily.   Avoiding the use of soaps on your nipples.   Start feedings on your left breast at one feeding and on your right breast at the next feeding.     You will notice an increase in your milk supply 2 to 5 days after delivery. You may feel some discomfort from engorgement, which makes your breasts very firm and often tender. Engorgement "peaks" out within 24 to 48 hours. In the meantime, apply warm moist towels to your breasts for 5 to 10 minutes before feeding. Gentle massage and expression of some milk before feeding will soften your breasts, making it easier for your baby to latch on. Wear a well fitting nursing bra and air dry your nipples for 10 to 15 minutes after each feeding.   Only use cotton bra pads.   Only use pure lanolin on your nipples after nursing. You do not need to wash it off before nursing.  Take care of yourself by:   Eating well-balanced meals and nutritious snacks.   Drinking milk, fruit juice, and water to satisfy your thirst (about 8 glasses a day).   Getting plenty of rest.   Increasing calcium in your diet (1200 mg a day).   Avoiding foods that you notice affect the baby in a bad way.  SEEK MEDICAL CARE IF:   You have any questions or difficulty  with breastfeeding.   You need help.   You have a hard, red, sore area on your breast, accompanied by a fever of 100.5 F (38.1 C) or more.   Your baby is too sleepy to eat well or is having trouble sleeping.   Your baby is wetting less than 6 diapers per day, by 5 days of age.   Your baby's skin or white part of his or her eyes is more yellow than it was in the hospital.   You feel depressed.  Document Released: 02/05/2005 Document Revised: 10/18/2010 Document Reviewed: 09/20/2008 [employer] Patient Information 2012 [employer]. 

## 2011-01-03 NOTE — Progress Notes (Signed)
Addended by: Archie Patten on: 01/03/2011 11:30 AM   Modules accepted: Orders

## 2011-01-03 NOTE — Progress Notes (Signed)
Well, no new c/o. Ongoing swelling in LE, right greater than left, no signs of DVT, chronic issue, recommended comfort measures, support hose. No contractions, + fetal movement. Rev'd precautions. GBS and GC at next visit.

## 2011-01-03 NOTE — Progress Notes (Signed)
P=80, c/o mild edema right leg as usual since about July- c/o pain when stands along time and states" it tends to go to sleep a lot", c/o pelvic pressure- and states yesterday felt a lot of pelvic pain and pressure , but none today

## 2011-01-08 ENCOUNTER — Ambulatory Visit (INDEPENDENT_AMBULATORY_CARE_PROVIDER_SITE_OTHER): Payer: Medicaid Other | Admitting: Physician Assistant

## 2011-01-08 VITALS — BP 109/69 | Temp 97.5°F | Wt 206.5 lb

## 2011-01-08 DIAGNOSIS — O09899 Supervision of other high risk pregnancies, unspecified trimester: Secondary | ICD-10-CM

## 2011-01-08 DIAGNOSIS — Z22330 Carrier of Group B streptococcus: Secondary | ICD-10-CM

## 2011-01-08 DIAGNOSIS — O99330 Smoking (tobacco) complicating pregnancy, unspecified trimester: Secondary | ICD-10-CM

## 2011-01-08 DIAGNOSIS — O99820 Streptococcus B carrier state complicating pregnancy: Secondary | ICD-10-CM

## 2011-01-08 DIAGNOSIS — Z3480 Encounter for supervision of other normal pregnancy, unspecified trimester: Secondary | ICD-10-CM

## 2011-01-08 LAB — POCT URINALYSIS DIP (DEVICE)
Hgb urine dipstick: NEGATIVE
Nitrite: NEGATIVE
Protein, ur: NEGATIVE mg/dL
Specific Gravity, Urine: 1.025 (ref 1.005–1.030)
Urobilinogen, UA: 1 mg/dL (ref 0.0–1.0)
pH: 7 (ref 5.0–8.0)

## 2011-01-08 NOTE — Progress Notes (Signed)
Swelling in right leg. No vaginal discharge. Pulse 80.

## 2011-01-08 NOTE — Progress Notes (Signed)
No complaints. +FM daily, no PTL s/s. GBS/GC/Chl obtained today.

## 2011-01-15 DIAGNOSIS — O99820 Streptococcus B carrier state complicating pregnancy: Secondary | ICD-10-CM | POA: Insufficient documentation

## 2011-01-15 NOTE — Progress Notes (Signed)
+  GBS added to results console 

## 2011-01-17 ENCOUNTER — Ambulatory Visit (INDEPENDENT_AMBULATORY_CARE_PROVIDER_SITE_OTHER): Payer: Medicaid Other | Admitting: Advanced Practice Midwife

## 2011-01-17 ENCOUNTER — Other Ambulatory Visit: Payer: Self-pay

## 2011-01-17 ENCOUNTER — Encounter: Payer: Self-pay | Admitting: Advanced Practice Midwife

## 2011-01-17 VITALS — BP 118/73 | HR 88 | Temp 97.9°F | Wt 207.1 lb

## 2011-01-17 DIAGNOSIS — O99330 Smoking (tobacco) complicating pregnancy, unspecified trimester: Secondary | ICD-10-CM

## 2011-01-17 DIAGNOSIS — Z3480 Encounter for supervision of other normal pregnancy, unspecified trimester: Secondary | ICD-10-CM

## 2011-01-17 LAB — POCT URINALYSIS DIP (DEVICE)
Bilirubin Urine: NEGATIVE
Glucose, UA: 100 mg/dL — AB
Hgb urine dipstick: NEGATIVE
Nitrite: NEGATIVE
Specific Gravity, Urine: >=1.03 (ref 1.005–1.030)
pH: 5.5 (ref 5.0–8.0)

## 2011-01-17 NOTE — Progress Notes (Signed)
Doing well. Labor precautions reviewed. GC/Chl/GBS all negative. Discussed circumcision and Peds choice.

## 2011-01-17 NOTE — Progress Notes (Signed)
Edema on hands, legs and feet

## 2011-01-17 NOTE — Patient Instructions (Signed)
Pregnancy - Third Trimester The third trimester of pregnancy (the last 3 months) is a period of the most rapid growth for you and your baby. The baby approaches a length of 20 inches and a weight of 6 to 10 pounds. The baby is adding on fat and getting ready for life outside your body. While inside, babies have periods of sleeping and waking, suck their thumbs, and hiccups. You can often feel small contractions of the uterus. This is false labor. It is also called Braxton-Hicks contractions. This is like a practice for labor. The usual problems in this stage of pregnancy include more difficulty breathing, swelling of the hands and feet from water retention, and having to urinate more often because of the uterus and baby pressing on your bladder.  PRENATAL EXAMS  Blood work may continue to be done during prenatal exams. These tests are done to check on your health and the probable health of your baby. Blood work is used to follow your blood levels (hemoglobin). Anemia (low hemoglobin) is common during pregnancy. Iron and vitamins are given to help prevent this. You may also continue to be checked for diabetes. Some of the past blood tests may be done again.   The size of the uterus is measured during each visit. This makes sure your baby is growing properly according to your pregnancy dates.   Your blood pressure is checked every prenatal visit. This is to make sure you are not getting toxemia.   Your urine is checked every prenatal visit for infection, diabetes and protein.   Your weight is checked at each visit. This is done to make sure gains are happening at the suggested rate and that you and your baby are growing normally.   Sometimes, an ultrasound is performed to confirm the position and the proper growth and development of the baby. This is a test done that bounces harmless sound waves off the baby so your caregiver can more accurately determine due dates.   Discuss the type of pain  medication and anesthesia you will have during your labor and delivery.   Discuss the possibility and anesthesia if a Cesarean Section might be necessary.   Inform your caregiver if there is any mental or physical violence at home.  Sometimes, a specialized non-stress test, contraction stress test and biophysical profile are done to make sure the baby is not having a problem. Checking the amniotic fluid surrounding the baby is called an amniocentesis. The amniotic fluid is removed by sticking a needle into the belly (abdomen). This is sometimes done near the end of pregnancy if an early delivery is required. In this case, it is done to help make sure the baby's lungs are mature enough for the baby to live outside of the womb. If the lungs are not mature and it is unsafe to deliver the baby, an injection of cortisone medication is given to the mother 1 to 2 days before the delivery. This helps the baby's lungs mature and makes it safer to deliver the baby. CHANGES OCCURING IN THE THIRD TRIMESTER OF PREGNANCY Your body goes through many changes during pregnancy. They vary from person to person. Talk to your caregiver about changes you notice and are concerned about.  During the last trimester, you have probably had an increase in your appetite. It is normal to have cravings for certain foods. This varies from person to person and pregnancy to pregnancy.   You may begin to get stretch marks on your hips,   abdomen, and breasts. These are normal changes in the body during pregnancy. There are no exercises or medications to take which prevent this change.   Constipation may be treated with a stool softener or adding bulk to your diet. Drinking lots of fluids, fiber in vegetables, fruits, and whole grains are helpful.   Exercising is also helpful. If you have been very active up until your pregnancy, most of these activities can be continued during your pregnancy. If you have been less active, it is helpful  to start an exercise program such as walking. Consult your caregiver before starting exercise programs.   Avoid all smoking, alcohol, un-prescribed drugs, herbs and "street drugs" during your pregnancy. These chemicals affect the formation and growth of the baby. Avoid chemicals throughout the pregnancy to ensure the delivery of a healthy infant.   Backache, varicose veins and hemorrhoids may develop or get worse.   You will tire more easily in the third trimester, which is normal.   The baby's movements may be stronger and more often.   You may become short of breath easily.   Your belly button may stick out.   A yellow discharge may leak from your breasts called colostrum.   You may have a bloody mucus discharge. This usually occurs a few days to a week before labor begins.  HOME CARE INSTRUCTIONS   Keep your caregiver's appointments. Follow your caregiver's instructions regarding medication use, exercise, and diet.   During pregnancy, you are providing food for you and your baby. Continue to eat regular, well-balanced meals. Choose foods such as meat, fish, milk and other low fat dairy products, vegetables, fruits, and whole-grain breads and cereals. Your caregiver will tell you of the ideal weight gain.   A physical sexual relationship may be continued throughout pregnancy if there are no other problems such as early (premature) leaking of amniotic fluid from the membranes, vaginal bleeding, or belly (abdominal) pain.   Exercise regularly if there are no restrictions. Check with your caregiver if you are unsure of the safety of your exercises. Greater weight gain will occur in the last 2 trimesters of pregnancy. Exercising helps:   Control your weight.   Get you in shape for labor and delivery.   You lose weight after you deliver.   Rest a lot with legs elevated, or as needed for leg cramps or low back pain.   Wear a good support or jogging bra for breast tenderness during  pregnancy. This may help if worn during sleep. Pads or tissues may be used in the bra if you are leaking colostrum.   Do not use hot tubs, steam rooms, or saunas.   Wear your seat belt when driving. This protects you and your baby if you are in an accident.   Avoid raw meat, cat litter boxes and soil used by cats. These carry germs that can cause birth defects in the baby.   It is easier to loose urine during pregnancy. Tightening up and strengthening the pelvic muscles will help with this problem. You can practice stopping your urination while you are going to the bathroom. These are the same muscles you need to strengthen. It is also the muscles you would use if you were trying to stop from passing gas. You can practice tightening these muscles up 10 times a set and repeating this about 3 times per day. Once you know what muscles to tighten up, do not perform these exercises during urination. It is more likely   to cause an infection by backing up the urine.   Ask for help if you have financial, counseling or nutritional needs during pregnancy. Your caregiver will be able to offer counseling for these needs as well as refer you for other special needs.   Make a list of emergency phone numbers and have them available.   Plan on getting help from family or friends when you go home from the hospital.   Make a trial run to the hospital.   Take prenatal classes with the father to understand, practice and ask questions about the labor and delivery.   Prepare the baby's room/nursery.   Do not travel out of the city unless it is absolutely necessary and with the advice of your caregiver.   Wear only low or no heal shoes to have better balance and prevent falling.  MEDICATIONS AND DRUG USE IN PREGNANCY  Take prenatal vitamins as directed. The vitamin should contain 1 milligram of folic acid. Keep all vitamins out of reach of children. Only a couple vitamins or tablets containing iron may be fatal  to a baby or young child when ingested.   Avoid use of all medications, including herbs, over-the-counter medications, not prescribed or suggested by your caregiver. Only take over-the-counter or prescription medicines for pain, discomfort, or fever as directed by your caregiver. Do not use aspirin, ibuprofen (Motrin, Advil, Nuprin) or naproxen (Aleve) unless OK'd by your caregiver.   Let your caregiver also know about herbs you may be using.   Alcohol is related to a number of birth defects. This includes fetal alcohol syndrome. All alcohol, in any form, should be avoided completely. Smoking will cause low birth rate and premature babies.   Street/illegal drugs are very harmful to the baby. They are absolutely forbidden. A baby born to an addicted mother will be addicted at birth. The baby will go through the same withdrawal an adult does.  SEEK MEDICAL CARE IF: You have any concerns or worries during your pregnancy. It is better to call with your questions if you feel they cannot wait, rather than worry about them. DECISIONS ABOUT CIRCUMCISION You may or may not know the sex of your baby. If you know your baby is a boy, it may be time to think about circumcision. Circumcision is the removal of the foreskin of the penis. This is the skin that covers the sensitive end of the penis. There is no proven medical need for this. Often this decision is made on what is popular at the time or based upon religious beliefs and social issues. You can discuss these issues with your caregiver or pediatrician. SEEK IMMEDIATE MEDICAL CARE IF:   An unexplained oral temperature above 102 F (38.9 C) develops, or as your caregiver suggests.   You have leaking of fluid from the vagina (birth canal). If leaking membranes are suspected, take your temperature and tell your caregiver of this when you call.   There is vaginal spotting, bleeding or passing clots. Tell your caregiver of the amount and how many pads are  used.   You develop a bad smelling vaginal discharge with a change in the color from clear to white.   You develop vomiting that lasts more than 24 hours.   You develop chills or fever.   You develop shortness of breath.   You develop burning on urination.   You loose more than 2 pounds of weight or gain more than 2 pounds of weight or as suggested by your   caregiver.   You notice sudden swelling of your face, hands, and feet or legs.   You develop belly (abdominal) pain. Round ligament discomfort is a common non-cancerous (benign) cause of abdominal pain in pregnancy. Your caregiver still must evaluate you.   You develop a severe headache that does not go away.   You develop visual problems, blurred or double vision.   If you have not felt your baby move for more than 1 hour. If you think the baby is not moving as much as usual, eat something with sugar in it and lie down on your left side for an hour. The baby should move at least 4 to 5 times per hour. Call right away if your baby moves less than that.   You fall, are in a car accident or any kind of trauma.   There is mental or physical violence at home.  Document Released: 01/30/2001 Document Revised: 10/18/2010 Document Reviewed: 08/04/2008 [employer] Patient Information 2012 [employer]. 

## 2011-01-24 ENCOUNTER — Ambulatory Visit (INDEPENDENT_AMBULATORY_CARE_PROVIDER_SITE_OTHER): Payer: Medicaid Other | Admitting: Obstetrics & Gynecology

## 2011-01-24 DIAGNOSIS — O99330 Smoking (tobacco) complicating pregnancy, unspecified trimester: Secondary | ICD-10-CM

## 2011-01-24 DIAGNOSIS — R81 Glycosuria: Secondary | ICD-10-CM

## 2011-01-24 DIAGNOSIS — Z3480 Encounter for supervision of other normal pregnancy, unspecified trimester: Secondary | ICD-10-CM

## 2011-01-24 LAB — POCT URINALYSIS DIP (DEVICE)
Hgb urine dipstick: NEGATIVE
Ketones, ur: NEGATIVE mg/dL
Specific Gravity, Urine: 1.025 (ref 1.005–1.030)
pH: 5.5 (ref 5.0–8.0)

## 2011-01-24 NOTE — Progress Notes (Signed)
Pressure, occas UC, no ROM. Labor precautions given.

## 2011-01-24 NOTE — Patient Instructions (Signed)
Normal Labor and Delivery °Your caregiver must first be sure you are in labor. Signs of labor include: °· You may pass what is called "the mucus plug" before labor begins. This is a small amount of blood stained mucus.  °· Regular uterine contractions.  °· The time between contractions get closer together.  °· The discomfort and pain gradually gets more intense.  °· Pains are mostly located in the back.  °· Pains get worse when walking.  °· The cervix (the opening of the uterus becomes thinner (begins to efface) and opens up (dilates).  °Once you are in labor and admitted into the hospital or care center, your caregiver will do the following: °· A complete physical examination.  °· Check your vital signs (blood pressure, pulse, temperature and the fetal heart rate).  °· Do a vaginal examination (using a sterile glove and lubricant) to determine:  °· The position (presentation) of the baby (head [vertex] or buttock first).  °· The level (station) of the baby's head in the birth canal.  °· The effacement and dilatation of the cervix.  °· You may have your pubic hair shaved and be given an enema depending on your caregiver and the circumstance.  °· An electronic monitor is usually placed on your abdomen. The monitor follows the length and intensity of the contractions, as well as the baby's heart rate.  °· Usually, your caregiver will insert an IV in your arm with a bottle of sugar water. This is done as a precaution so that medications can be given to you quickly during labor or delivery.  °NORMAL LABOR AND DELIVERY IS DIVIDED UP INTO 3 STAGES: °First Stage °This is when regular contractions begin and the cervix begins to efface and dilate. This stage can last from 3 to 15 hours. The end of the first stage is when the cervix is 100% effaced and 10 centimeters dilated. Pain medications may be given by  °· Injection (morphine, demerol, etc.)  °· Regional anesthesia (spinal, caudal or epidural, anesthetics given in  different locations of the spine). Paracervical pain medication may be given, which is an injection of and anesthetic on each side of the cervix.  °A pregnant woman may request to have "Natural Childbirth" which is not to have any medications or anesthesia during her labor and delivery. °Second Stage °This is when the baby comes down through the birth canal (vagina) and is born. This can take 1 to 4 hours. As the baby's head comes down through the birth canal, you may feel like you are going to have a bowel movement. You will get the urge to bear down and push until the baby is delivered. As the baby's head is being delivered, the caregiver will decide if an episiotomy (a cut in the perineum and vagina area) is needed to prevent tearing of the tissue in this area. The episiotomy is sewn up after the delivery of the baby and placenta. Sometimes a mask with nitrous oxide is given for the mother to breath during the delivery of the baby to help if there is too much pain. The end of Stage 2 is when the baby is fully delivered. Then when the umbilical cord stops pulsating it is clamped and cut. °Third Stage °The third stage begins after the baby is completely delivered and ends after the placenta (afterbirth) is delivered. This usually takes 5 to 30 minutes. After the placenta is delivered, a medication is given either by intravenous or injection to help contract   the uterus and prevent bleeding. The third stage is not painful and pain medication is usually not necessary. If an episiotomy was done, it is repaired at this time. °After the delivery, the mother is watched and monitored closely for 1 to 2 hours to make sure there is no postpartum bleeding (hemorrhage). If there is a lot of bleeding, medication is given to contract the uterus and stop the bleeding. °Document Released: 11/15/2007 Document Revised: 10/18/2010 Document Reviewed: 11/15/2007 °[employer]® Patient Information ©2012 [employer]. °

## 2011-01-24 NOTE — Progress Notes (Signed)
Swelling in feet, has some pelvic pressure and cramping

## 2011-01-25 ENCOUNTER — Telehealth: Payer: Self-pay | Admitting: *Deleted

## 2011-01-25 NOTE — Telephone Encounter (Signed)
Patient called and left a message she was calling to see if there is any medicine she can take for her cough. Called patient- per review she is [redacted] weeks pregnant, c/o cough , denies fever, denies body aches, c/o cough .  Informed patient may take delsym or robitusin plain/ or DM . Patient asks if can take tylenol . Informed patient may take tylenol as needed per dosage on bottle.  If illness worsens call back to clinic or provider.

## 2011-01-31 ENCOUNTER — Ambulatory Visit (INDEPENDENT_AMBULATORY_CARE_PROVIDER_SITE_OTHER): Payer: Medicaid Other | Admitting: Advanced Practice Midwife

## 2011-01-31 ENCOUNTER — Other Ambulatory Visit: Payer: Self-pay | Admitting: Advanced Practice Midwife

## 2011-01-31 DIAGNOSIS — O99330 Smoking (tobacco) complicating pregnancy, unspecified trimester: Secondary | ICD-10-CM

## 2011-01-31 DIAGNOSIS — O09899 Supervision of other high risk pregnancies, unspecified trimester: Secondary | ICD-10-CM

## 2011-01-31 DIAGNOSIS — R81 Glycosuria: Secondary | ICD-10-CM

## 2011-01-31 LAB — POCT URINALYSIS DIP (DEVICE)
Hgb urine dipstick: NEGATIVE
Ketones, ur: NEGATIVE mg/dL
Protein, ur: NEGATIVE mg/dL
Specific Gravity, Urine: >=1.03 (ref 1.005–1.030)

## 2011-01-31 NOTE — Progress Notes (Signed)
Pulse-75. Edema-right leg

## 2011-01-31 NOTE — Progress Notes (Signed)
No complaints today. Patient has had URI like symptoms for a few weeks. Coughing causes pain/pressure. No contractions, no fluid, no bleeding, no discharge. Good fetal movement.  No doctor selected for the baby yet. She will breast feed. She has a carseat. Baby will sleep in bassinet. She plans on having her tubes tied- signed papers last week. Discussed circ. Labor precautions given. Follow-up in one week.

## 2011-01-31 NOTE — Patient Instructions (Signed)
Please come back in one week  Normal Labor and Delivery Your caregiver must first be sure you are in labor. Signs of labor include:  You may pass what is called "the mucus plug" before labor begins. This is a small amount of blood stained mucus.   Regular uterine contractions.   The time between contractions get closer together.   The discomfort and pain gradually gets more intense.   Pains are mostly located in the back.   Pains get worse when walking.   The cervix (the opening of the uterus becomes thinner (begins to efface) and opens up (dilates).  Once you are in labor and admitted into the hospital or care center, your caregiver will do the following:  A complete physical examination.   Check your vital signs (blood pressure, pulse, temperature and the fetal heart rate).   Do a vaginal examination (using a sterile glove and lubricant) to determine:   The position (presentation) of the baby (head [vertex] or buttock first).   The level (station) of the baby's head in the birth canal.   The effacement and dilatation of the cervix.   You may have your pubic hair shaved and be given an enema depending on your caregiver and the circumstance.   An electronic monitor is usually placed on your abdomen. The monitor follows the length and intensity of the contractions, as well as the baby's heart rate.   Usually, your caregiver will insert an IV in your arm with a bottle of sugar water. This is done as a precaution so that medications can be given to you quickly during labor or delivery.  NORMAL LABOR AND DELIVERY IS DIVIDED UP INTO 3 STAGES: First Stage This is when regular contractions begin and the cervix begins to efface and dilate. This stage can last from 3 to 15 hours. The end of the first stage is when the cervix is 100% effaced and 10 centimeters dilated. Pain medications may be given by   Injection (morphine, demerol, etc.)   Regional anesthesia (spinal, caudal or  epidural, anesthetics given in different locations of the spine). Paracervical pain medication may be given, which is an injection of and anesthetic on each side of the cervix.  A pregnant woman may request to have "Natural Childbirth" which is not to have any medications or anesthesia during her labor and delivery. Second Stage This is when the baby comes down through the birth canal (vagina) and is born. This can take 1 to 4 hours. As the baby's head comes down through the birth canal, you may feel like you are going to have a bowel movement. You will get the urge to bear down and push until the baby is delivered. As the baby's head is being delivered, the caregiver will decide if an episiotomy (a cut in the perineum and vagina area) is needed to prevent tearing of the tissue in this area. The episiotomy is sewn up after the delivery of the baby and placenta. Sometimes a mask with nitrous oxide is given for the mother to breath during the delivery of the baby to help if there is too much pain. The end of Stage 2 is when the baby is fully delivered. Then when the umbilical cord stops pulsating it is clamped and cut. Third Stage The third stage begins after the baby is completely delivered and ends after the placenta (afterbirth) is delivered. This usually takes 5 to 30 minutes. After the placenta is delivered, a medication is given either  by intravenous or injection to help contract the uterus and prevent bleeding. The third stage is not painful and pain medication is usually not necessary. If an episiotomy was done, it is repaired at this time. After the delivery, the mother is watched and monitored closely for 1 to 2 hours to make sure there is no postpartum bleeding (hemorrhage). If there is a lot of bleeding, medication is given to contract the uterus and stop the bleeding. Document Released: 11/15/2007 Document Revised: 10/18/2010 Document Reviewed: 11/15/2007 [facility] Patient Information 2012  [place], [place].

## 2011-02-06 ENCOUNTER — Encounter (HOSPITAL_COMMUNITY): Payer: Self-pay | Admitting: *Deleted

## 2011-02-06 ENCOUNTER — Telehealth (HOSPITAL_COMMUNITY): Payer: Self-pay | Admitting: *Deleted

## 2011-02-06 NOTE — Telephone Encounter (Signed)
Preadmission screen  

## 2011-02-07 ENCOUNTER — Ambulatory Visit (INDEPENDENT_AMBULATORY_CARE_PROVIDER_SITE_OTHER): Payer: Medicaid Other | Admitting: Obstetrics and Gynecology

## 2011-02-07 ENCOUNTER — Other Ambulatory Visit: Payer: Self-pay | Admitting: Obstetrics & Gynecology

## 2011-02-07 VITALS — BP 114/77 | Wt 208.1 lb

## 2011-02-07 DIAGNOSIS — Z3480 Encounter for supervision of other normal pregnancy, unspecified trimester: Secondary | ICD-10-CM

## 2011-02-07 DIAGNOSIS — O480 Post-term pregnancy: Secondary | ICD-10-CM

## 2011-02-07 LAB — POCT URINALYSIS DIP (DEVICE)
Bilirubin Urine: NEGATIVE
Glucose, UA: NEGATIVE mg/dL
Ketones, ur: NEGATIVE mg/dL
Nitrite: NEGATIVE
Protein, ur: NEGATIVE mg/dL
Specific Gravity, Urine: 1.025 (ref 1.005–1.030)
Urobilinogen, UA: 0.2 mg/dL (ref 0.0–1.0)
pH: 7 (ref 5.0–8.0)

## 2011-02-07 NOTE — Progress Notes (Signed)
P=85, c/o trace edema Right leg as usual and hands, c/o headache x2 days =7, taking tylenol without relief- states get migraines, but unable to take those meds they make her sick,

## 2011-02-07 NOTE — Progress Notes (Signed)
IOL scheduled on 02/14/11 @ 0700.  Pt to exam Rm following fetal testing

## 2011-02-07 NOTE — Progress Notes (Signed)
Only complaint is a headache- taking Tylenol with little relief. (History of migraine). Some photophobia. Had reassuring NST today for post-dates. Some trace edema. Plans on breast feeding. Planning on tubal after delivery (Papers signed) Cough improved. Discussed s/sx of labor.  Plan: Induction on 12/26 at 0700. Will return for postpartum visit. Tubal ligation needs to be scheduled.

## 2011-02-07 NOTE — Patient Instructions (Signed)
It was nice to see you again today. Best wishes and Sandra Oliver! Sandra Oliver M. Sandra Oliver, M.D.   Labor Induction A pregnant woman usually goes into labor spontaneously before the birth of her baby. Most babies are born between 54 and 42 weeks of the pregnancy. When this does not happen, caregivers may use medication or other methods to bring on (induce) labor. Labor induction causes a pregnant woman's uterus to contract, the cervix to open (dilate) and thin out (efface) to prepare for the vaginal birth of her baby. Several methods of labor induction may be used such as:  Massaging the nipple and areola of the breasts (nipple stimulation).   Prostaglandin medication used orally or as a vaginal cream.   Striping of membranes (your caregiver inserts a finger between the cervix and membranes around the baby's head) causes the body to produce prostaglandins that soften the cervix and cause the uterus to contract.   Rupture of the water bag (amniotomy).   Oxytocin by IV.   Special dilators placed into the cervical canal that causes the cervix to soften and open.   Mechanical devices to stretch open the cervix such as, a dilated foley catheter.  Whether your labor will be induced depends on the condition of you and your baby, how far along you are, are the baby's lung maturity, the condition of the cervix, the way the baby is lying, and other factors. Usually, labor is not induced before 39 weeks of the pregnancy unless there is a problem with the baby or mother, and it becomes necessary to induce labor. REASONS LABOR SHOULD BE INDUCED  The health of the baby or mother has become at risk.   The pregnancy is overdue by 2 weeks or more.   Your water breaks (premature rupture of membranes), the baby's lungs are mature, and labor does not start on its own.   You develop high blood pressure (toxemia of pregnancy).   You develop an infection in your uterus.   You have diabetes or other serious  medical illness.   Amniotic fluid amounts are small around the baby.   Your placenta begins to separate from the inner wall of the uterus before the baby is born (placental abruption). This condition may cause you to have an emergency Cesarean delivery.   You have fetal death.   A social induction is also known as an induction for convenience. Most of the time, labor is induced for sound medical reasons. Sometimes, it is done as a Agricultural engineer. Living a long way from the hospital or having a history of very rapid labors may be reasons the mother may want to induce delivery.  REASONS LABOR SHOULD NOT BE INDUCED  You have had previous surgeries on your uterus. This is especially true if the surgeries went into the inside lining and cavity of the uterus. This gives an added risk for rupturing the uterus.   You have placenta previa. This means your placenta lies very low in the uterus and blocks the opening (cervix) for the baby to get out.   Your baby is not in a head down position. For example, if your baby lies across your uterus (transverse) instead of head first.   If the umbilical cord drops down into the birth canal in front of your baby. This could cut off the baby's blood supply and oxygen to the baby.  RISKS AND COMPLICATIONS Problems seldom occur with labor induction, but there can be some complications. Some of the risks of  induction include:  Change in fetal heart rate (too high, too low or irradic).   Increased risk of a premature baby, even if you think your baby is term.   Increased risk of fetal distress. This means your baby gets into problems during induction. This can be caused by the umbilical cord coming out in front of the baby or is being squeezed.   Increased risk of infection to mother and baby.   Increased chance of having a Cesarean delivery. This is an operation on your belly (abdomen) to remove the baby.   Strong contractions can lead to abruption. This is a  separating of the placenta from the uterus.   Uterine rupture, especially if you had a previous Cesarean or surgery on your uterus.  When labor is induced because of medical problems, other risks may be present. Induced labor may lead to:  Increased use of medications for pain relief.   Other interventions.  When induction is needed for medical reasons, the benefits of induction may outweigh the risks. PROCEDURE It can sometimes take up to 2 or 3 days to induce labor. It usually takes less time. It takes longer when you are induced early in the pregnancy and for first pregnancies.  Before coming to the hospital for an induction:  Do not eat much before you come to the hospital (for at least 8 hours).   Do not eat after midnight if you are going to be induced the next morning.   Be aware that medications for labor induction can upset your stomach.   Let your caregiver know if you need medications for pain.  HOME CARE INSTRUCTIONS If you have been induced in your caregiver's office to start labor, and are allowed to go home, follow the instructions given to you by your care giver. SEEK IMMEDIATE MEDICAL CARE IF:  You develop any kind of vaginal bleeding.   You develop contractions that are severe and continuous.   You feel faint or feel light headed.   You do not develop contractions within the time your caregiver suggests you should.   You begin to run a temperature of 100 F (37.8 C) or develop chills.   You no longer feel the normal fetal movement.  Document Released: 06/27/2006 Document Revised: 10/18/2010 Document Reviewed: 10/15/2008 [facility] Patient Information 2012 [place], [place].

## 2011-02-11 ENCOUNTER — Inpatient Hospital Stay (HOSPITAL_COMMUNITY)
Admission: AD | Admit: 2011-02-11 | Discharge: 2011-02-11 | Disposition: A | Discharge status: Home / Self Care | Payer: Medicaid Other | Source: Ambulatory Visit | Attending: Obstetrics & Gynecology | Admitting: Obstetrics & Gynecology

## 2011-02-11 ENCOUNTER — Encounter (HOSPITAL_COMMUNITY): Payer: Self-pay | Admitting: Obstetrics and Gynecology

## 2011-02-11 DIAGNOSIS — O479 False labor, unspecified: Secondary | ICD-10-CM | POA: Insufficient documentation

## 2011-02-11 HISTORY — DX: Renal tubulo-interstitial disease, unspecified: N15.9

## 2011-02-11 NOTE — Progress Notes (Signed)
Notified of no cervical change.  Discharge order received.

## 2011-02-11 NOTE — Progress Notes (Signed)
Pt returned from walking at this time.  efm and toco placed.

## 2011-02-11 NOTE — Progress Notes (Signed)
Notified of pt presenting for labor check.  Notified of ve and contraction pattern.  Orders to recheck pt in one hour.

## 2011-02-11 NOTE — Progress Notes (Signed)
Contractions for about one hour every 5 minutes, no vaginal bleeding or leaking.

## 2011-02-14 ENCOUNTER — Encounter (HOSPITAL_COMMUNITY): Payer: Self-pay | Admitting: Anesthesiology

## 2011-02-14 ENCOUNTER — Encounter (HOSPITAL_COMMUNITY): Payer: Self-pay

## 2011-02-14 ENCOUNTER — Inpatient Hospital Stay (HOSPITAL_COMMUNITY): Payer: Medicaid Other | Admitting: Anesthesiology

## 2011-02-14 ENCOUNTER — Inpatient Hospital Stay (HOSPITAL_COMMUNITY)
Admission: RE | Admit: 2011-02-14 | Discharge: 2011-02-17 | DRG: 775 | Disposition: A | Discharge status: Home / Self Care | Payer: Medicaid Other | Source: Ambulatory Visit | Attending: Obstetrics and Gynecology | Admitting: Obstetrics and Gynecology

## 2011-02-14 DIAGNOSIS — O480 Post-term pregnancy: Principal | ICD-10-CM | POA: Diagnosis present

## 2011-02-14 LAB — CBC
Hemoglobin: 12.3 g/dL (ref 12.0–15.0)
Platelets: 229 10*3/uL (ref 150–400)
RBC: 4.06 MIL/uL (ref 3.87–5.11)
WBC: 8.5 10*3/uL (ref 4.0–10.5)

## 2011-02-14 LAB — RPR: RPR Ser Ql: NONREACTIVE

## 2011-02-14 MED ORDER — EPHEDRINE 5 MG/ML INJ
10.0000 mg | INTRAVENOUS | Status: DC | PRN
  Filled 2011-02-14: qty 4

## 2011-02-14 MED ORDER — FLEET ENEMA 7-19 GM/118ML RE ENEM: 1.0000 | ENEMA | RECTAL | Status: DC | PRN

## 2011-02-14 MED ORDER — PENICILLIN G POTASSIUM 5000000 UNITS IJ SOLR
2.5000 10*6.[IU] | INTRAVENOUS | Status: DC
  Administered 2011-02-14 (×3): 2.5 10*6.[IU] via INTRAVENOUS
  Filled 2011-02-14 (×7): qty 2.5

## 2011-02-14 MED ORDER — FENTANYL 2.5 MCG/ML BUPIVACAINE 1/10 % EPIDURAL INFUSION (WH - ANES)
INTRAMUSCULAR | Status: DC | PRN
  Administered 2011-02-14: 14 mL/h via EPIDURAL

## 2011-02-14 MED ORDER — OXYTOCIN 20 UNITS IN LACTATED RINGERS INFUSION - SIMPLE
1.0000 m[IU]/min | INTRAVENOUS | Status: DC
  Administered 2011-02-14: 4 m[IU]/min via INTRAVENOUS
  Administered 2011-02-14: 6 m[IU]/min via INTRAVENOUS
  Administered 2011-02-14: 2 m[IU]/min via INTRAVENOUS
  Administered 2011-02-14 (×2): 8 m[IU]/min via INTRAVENOUS
  Filled 2011-02-14: qty 1000

## 2011-02-14 MED ORDER — PHENYLEPHRINE 40 MCG/ML (10ML) SYRINGE FOR IV PUSH (FOR BLOOD PRESSURE SUPPORT): 80.0000 ug | PREFILLED_SYRINGE | INTRAVENOUS | Status: DC | PRN

## 2011-02-14 MED ORDER — FENTANYL CITRATE 0.05 MG/ML IJ SOLN: 100.0000 ug | INTRAMUSCULAR | Status: DC | PRN

## 2011-02-14 MED ORDER — OXYTOCIN BOLUS FROM INFUSION
500.0000 mL | Freq: Once | INTRAVENOUS | Status: DC
  Filled 2011-02-14: qty 500

## 2011-02-14 MED ORDER — OXYTOCIN 20 UNITS IN LACTATED RINGERS INFUSION - SIMPLE: 125.0000 mL/h | Freq: Once | INTRAVENOUS | Status: DC

## 2011-02-14 MED ORDER — TERBUTALINE SULFATE 1 MG/ML IJ SOLN: 0.2500 mg | Freq: Once | INTRAMUSCULAR | Status: AC | PRN

## 2011-02-14 MED ORDER — SODIUM CHLORIDE 0.9 % IJ SOLN: 3.0000 mL | Freq: Two times a day (BID) | INTRAMUSCULAR | Status: DC

## 2011-02-14 MED ORDER — ONDANSETRON HCL 4 MG/2ML IJ SOLN
4.0000 mg | Freq: Four times a day (QID) | INTRAMUSCULAR | Status: DC | PRN
  Administered 2011-02-14: 4 mg via INTRAVENOUS
  Filled 2011-02-14: qty 2

## 2011-02-14 MED ORDER — LIDOCAINE HCL 1.5 % IJ SOLN
INTRAMUSCULAR | Status: DC | PRN
  Administered 2011-02-14 (×2): 5 mL via EPIDURAL

## 2011-02-14 MED ORDER — IBUPROFEN 600 MG PO TABS
600.0000 mg | ORAL_TABLET | Freq: Four times a day (QID) | ORAL | Status: DC | PRN
  Filled 2011-02-14: qty 1

## 2011-02-14 MED ORDER — OXYCODONE-ACETAMINOPHEN 5-325 MG PO TABS: 2.0000 | ORAL_TABLET | ORAL | Status: DC | PRN

## 2011-02-14 MED ORDER — CITRIC ACID-SODIUM CITRATE 334-500 MG/5ML PO SOLN
30.0000 mL | ORAL | Status: DC | PRN
  Administered 2011-02-14: 30 mL via ORAL
  Filled 2011-02-14: qty 15

## 2011-02-14 MED ORDER — OXYTOCIN 20 UNITS IN LACTATED RINGERS INFUSION - SIMPLE: 1.0000 m[IU]/min | INTRAVENOUS | Status: DC

## 2011-02-14 MED ORDER — SODIUM CHLORIDE 0.9 % IJ SOLN: 3.0000 mL | INTRAMUSCULAR | Status: DC | PRN

## 2011-02-14 MED ORDER — PENICILLIN G POTASSIUM 5000000 UNITS IJ SOLR
5.0000 10*6.[IU] | Freq: Once | INTRAVENOUS | Status: AC
  Administered 2011-02-14: 5 10*6.[IU] via INTRAVENOUS
  Filled 2011-02-14: qty 5

## 2011-02-14 MED ORDER — LACTATED RINGERS IV SOLN: 500.0000 mL | Freq: Once | INTRAVENOUS | Status: DC

## 2011-02-14 MED ORDER — PHENYLEPHRINE 40 MCG/ML (10ML) SYRINGE FOR IV PUSH (FOR BLOOD PRESSURE SUPPORT)
80.0000 ug | PREFILLED_SYRINGE | INTRAVENOUS | Status: DC | PRN
  Filled 2011-02-14: qty 5

## 2011-02-14 MED ORDER — EPHEDRINE 5 MG/ML INJ: 10.0000 mg | INTRAVENOUS | Status: DC | PRN

## 2011-02-14 MED ORDER — SODIUM CHLORIDE 0.9 % IV SOLN: 250.0000 mL | INTRAVENOUS | Status: DC | PRN

## 2011-02-14 MED ORDER — LIDOCAINE HCL (PF) 1 % IJ SOLN: 30.0000 mL | INTRAMUSCULAR | Status: DC | PRN

## 2011-02-14 MED ORDER — LACTATED RINGERS IV SOLN: 500.0000 mL | INTRAVENOUS | Status: DC | PRN

## 2011-02-14 MED ORDER — DIPHENHYDRAMINE HCL 50 MG/ML IJ SOLN: 12.5000 mg | INTRAMUSCULAR | Status: DC | PRN

## 2011-02-14 MED ORDER — ACETAMINOPHEN 325 MG PO TABS: 650.0000 mg | ORAL_TABLET | ORAL | Status: DC | PRN

## 2011-02-14 MED ORDER — FAMOTIDINE IN NACL 20-0.9 MG/50ML-% IV SOLN
20.0000 mg | Freq: Once | INTRAVENOUS | Status: AC
  Administered 2011-02-14: 20 mg via INTRAVENOUS
  Filled 2011-02-14: qty 50

## 2011-02-14 MED ORDER — LACTATED RINGERS IV SOLN
INTRAVENOUS | Status: DC
  Administered 2011-02-14: 10:00:00 via INTRAVENOUS
  Administered 2011-02-14: 500 mL via INTRAVENOUS
  Administered 2011-02-14: 22:00:00 via INTRAVENOUS

## 2011-02-14 MED ORDER — FENTANYL 2.5 MCG/ML BUPIVACAINE 1/10 % EPIDURAL INFUSION (WH - ANES)
14.0000 mL/h | INTRAMUSCULAR | Status: DC
  Administered 2011-02-14 – 2011-02-15 (×2): 14 mL/h via EPIDURAL
  Filled 2011-02-14 (×3): qty 60

## 2011-02-14 NOTE — Progress Notes (Signed)
Sandra Oliver is a 30 y.o. G3P2002 at [redacted]w[redacted]d   Subjective: Foley bulb out requesting epidural  Objective: BP 146/58  Pulse 128  Temp(Src) 98.2 F (36.8 C) (Oral)  Resp 20  Ht 5\' 2"  (1.575 m)  Wt 213 lb (96.616 kg)  BMI 38.96 kg/m2  SpO2 100%  LMP 05/02/2010      FHT:  FHR: 135 bpm, variability: moderate,  accelerations:  Present,  decelerations:  Absent UC:   regular, every 3 minutes SVE:   Dilation: 4.5 Effacement (%): 60 Station: -2 Exam by:: Raliegh Ip RN  Labs: Lab Results  Component Value Date   WBC 8.5 02/14/2011   HGB 12.3 02/14/2011   HCT 36.3 02/14/2011   MCV 89.4 02/14/2011   PLT 229 02/14/2011    Assessment / Plan: IOL in progress, progressing normally  Labor: Progressing normally Preeclampsia:  na Fetal Wellbeing:  Category I Pain Control:  Epidural ordered I/D:  PCN for GBS Anticipated MOD:  NSVD  Sandra Oliver E. 02/14/2011, 5:29 PM

## 2011-02-14 NOTE — Progress Notes (Signed)
Sandra Oliver is a 30 y.o. [id number] at [redacted]w[redacted]d  admitted for induction of labor due to Post dates.  Subjective: Breathing with ctx  Objective: BP 110/57  Pulse 78  Temp(Src) 98 F (36.7 C) (Oral)  Resp 20  Ht 5\' 2"  (1.575 m)  Wt 213 lb (96.616 kg)  BMI 38.96 kg/m2  LMP 05/02/2010      FHT:  FHR: 140 bpm, variability: moderate,  accelerations:  Present,  decelerations:  Absent UC:   irregular SVE:   Foley in place  Labs: Lab Results  Component Value Date   WBC 8.5 02/14/2011   HGB 12.3 02/14/2011   HCT 36.3 02/14/2011   MCV 89.4 02/14/2011   PLT 229 02/14/2011    Assessment / Plan: IOL in progress, foley in place  Labor: Progressing normally Preeclampsia:  n/a Fetal Wellbeing:  Category I Pain Control:  Labor support without medications I/D:  PCN for GBS Anticipated MOD:  NSVD Ambulation strongly encouraged.  Sandra Klumpp E. 02/14/2011, 2:13 PM

## 2011-02-14 NOTE — Progress Notes (Signed)
Sandra Oliver is a 30 y.o. G3P2002 at [redacted]w[redacted]d Subjective: More comfortable with epidural.   Objective: BP 108/47  Pulse 96  Temp(Src) 99.2 F (37.3 C) (Oral)  Resp 20  Ht 5\' 2"  (1.575 m)  Wt 213 lb (96.616 kg)  BMI 38.96 kg/m2  SpO2 100%  LMP 05/02/2010      FHT:  FHR: 135 bpm, variability: moderate,  accelerations:  Present,  decelerations:  Absent UC:   regular, every 3-5 minutes SVE:   Dilation: 5 Effacement (%): 70 Station: -2 Exam by:: erin davis rn IUPC placed  Labs: Lab Results  Component Value Date   WBC 8.5 02/14/2011   HGB 12.3 02/14/2011   HCT 36.3 02/14/2011   MCV 89.4 02/14/2011   PLT 229 02/14/2011    Assessment / Plan: IOL in progress, IUPC placed  Labor: Progressing normally Preeclampsia:  n/a Fetal Wellbeing:  Category I Pain Control:  Epidural I/D:  PCN Anticipated MOD:  NSVD  Sandra Paff E. 02/14/2011, 10:47 PM

## 2011-02-14 NOTE — Anesthesia Procedure Notes (Signed)
Epidural Patient location during procedure: OB Start time: 02/14/2011 5:22 PM End time: 02/14/2011 5:28 PM Reason for block: procedure for pain  Staffing Anesthesiologist: Sandrea Hughs Performed by: anesthesiologist   Preanesthetic Checklist Completed: patient identified, site marked, surgical consent, pre-op evaluation, timeout performed, IV checked, risks and benefits discussed and monitors and equipment checked  Epidural Patient position: sitting Prep: site prepped and draped and DuraPrep Patient monitoring: continuous pulse ox and blood pressure Approach: midline Injection technique: LOR air  Needle:  Needle type: Tuohy  Needle gauge: 17 G Needle length: 9 cm Needle insertion depth: 6 cm Catheter type: closed end flexible Catheter size: 19 Gauge Catheter at skin depth: 11 cm Test dose: negative and 1.5% lidocaine  Assessment Sensory level: T8 Events: blood not aspirated, injection not painful, no injection resistance, negative IV test and no paresthesia

## 2011-02-14 NOTE — H&P (Signed)
Chief Complaint: IOL for postdates  Sandra Oliver is  30 y.o. [id number].  Patient's last menstrual period was 05/02/2010..  [redacted]w[redacted]d .  Obstetrical/Gynecological History: OB History    Grav Para Term Preterm Abortions TAB SAB Ect Mult Living   3 2 2  0 0 0 0 0 0 2      Past Medical History: Past Medical History  Diagnosis Date  . Kidney infection     Past Surgical History: Past Surgical History  Procedure Date  . Foot surgery 2001    bunion removal    Family History: Family History  Problem Relation Age of Onset  . Mental retardation Maternal Aunt     down syndrome  . Heart disease Paternal Uncle   . Heart disease Paternal Grandmother   . Diabetes Paternal Grandmother   . Heart disease Paternal Grandfather   . Cancer Maternal Grandmother     breast    Social History: History  Substance Use Topics  . Smoking status: Never Smoker   . Smokeless tobacco: Never Used  . Alcohol Use: No    Allergies: No Known Allergies  Prescriptions prior to admission  Medication Sig Dispense Refill  . acetaminophen (TYLENOL) 500 MG tablet Take 500 mg by mouth every 6 (six) hours as needed.        . calcium carbonate (TUMS - DOSED IN MG ELEMENTAL CALCIUM) 500 MG chewable tablet Chew 1 tablet by mouth daily as needed. For indigestion       . Prenatal Vit-Fe Fumarate-FA (MULTIVITAMIN-PRENATAL) 27-0.8 MG TABS Take 1 tablet by mouth daily.          Review of Systems - History obtained from chart review and the patient General ROS: negative ENT ROS: negative Allergy and Immunology ROS: negative Breast ROS: negative Respiratory ROS: no cough, shortness of breath, or wheezing Cardiovascular ROS: no chest pain or dyspnea on exertion Gastrointestinal ROS: no abdominal pain, change in bowel habits, or black or bloody stools Genito-Urinary ROS: no dysuria, trouble voiding, or hematuria Neurological ROS: negative  Physical Exam   Blood pressure 111/82, pulse 88, temperature 98.2 F (36.8  C), temperature source Oral, resp. rate 20, height 5\' 2"  (1.575 m), weight 213 lb (96.616 kg), last menstrual period 05/02/2010.  General: General appearance - alert, well appearing, and in no distress, oriented to person, place, and time and overweight Mental status - alert, oriented to person, place, and time, normal mood, behavior, speech, dress, motor activity, and thought processes, affect appropriate to mood Mouth - mucous membranes moist, pharynx normal without lesions Neck - supple, no significant adenopathy Lymphatics - no palpable lymphadenopathy, no hepatosplenomegaly Chest - clear to auscultation, no wheezes, rales or rhonchi, symmetric air entry Heart - normal rate, regular rhythm, normal S1, S2, no murmurs, rubs, clicks or gallops Abdomen - gravid, non tender Neurological - alert, oriented, normal speech, no focal findings or movement disorder noted Extremities - pedal edema 2 + Focused Gynecological Exam: loose 1cm/thick/vtx/post/-3 Cervical ripening balloon placed and filled with 60cc LR, AROM with placement of foley, clear fluid   Assessment: Post-term pregancy Patient Active Problem List  Diagnoses  . Supervision of other normal pregnancy  . Tobacco use in pregnancy, antepartum  . Glucosuria  . Glucosuria  . Abnormal glandular Papanicolaou smear of cervix  . GBS (group B Streptococcus carrier), +RV culture, currently pregnant    Plan: Admit with routine orders to BS Cervical ripening with foley bulb PCN for GBS prophylaxis  Lawanna Cecere E. 02/14/2011,10:27 AM

## 2011-02-14 NOTE — Anesthesia Preprocedure Evaluation (Signed)
Anesthesia Evaluation  Patient identified by MRN, date of birth, ID band Patient awake    Reviewed: Allergy & Precautions, H&P , NPO status , Patient's Chart, lab work & pertinent test results  Airway Mallampati: II TM Distance: >3 FB Neck ROM: full    Dental No notable dental hx.    Pulmonary neg pulmonary ROS,    Pulmonary exam normal       Cardiovascular neg cardio ROS     Neuro/Psych Negative Neurological ROS  Negative Psych ROS   GI/Hepatic negative GI ROS, Neg liver ROS,   Endo/Other  Morbid obesity  Renal/GU negative Renal ROS  Genitourinary negative   Musculoskeletal negative musculoskeletal ROS (+)   Abdominal (+) obese,   Peds negative pediatric ROS (+)  Hematology negative hematology ROS (+)   Anesthesia Other Findings   Reproductive/Obstetrics (+) Pregnancy                           Anesthesia Physical Anesthesia Plan  ASA: III  Anesthesia Plan: Epidural   Post-op Pain Management:    Induction:   Airway Management Planned:   Additional Equipment:   Intra-op Plan:   Post-operative Plan:   Informed Consent: I have reviewed the patients History and Physical, chart, labs and discussed the procedure including the risks, benefits and alternatives for the proposed anesthesia with the patient or authorized representative who has indicated his/her understanding and acceptance.     Plan Discussed with:   Anesthesia Plan Comments:         Anesthesia Quick Evaluation  

## 2011-02-14 NOTE — Progress Notes (Signed)
Sandra Oliver is a 30 y.o. [id number] at [redacted]w[redacted]d admitted for induction of labor due to postdates.  Subjective: Much more comfortable after epidural  Objective: BP 117/64  Pulse 97  Temp(Src) 98.2 F (36.8 C) (Oral)  Resp 20  Ht 5\' 2"  (1.575 m)  Wt 96.616 kg (213 lb)  BMI 38.96 kg/m2  SpO2 100%  LMP 05/02/2010      FHT:  FHR: 135 bpm, variability: moderate,  accelerations:  Abscent,  decelerations:  Absent UC:   regular, every 3-4  minutes SVE:   Dilation: 4.5 Effacement (%): 60 Station: -2 Exam by:: erin davis rn  Labs: Lab Results  Component Value Date   WBC 8.5 02/14/2011   HGB 12.3 02/14/2011   HCT 36.3 02/14/2011   MCV 89.4 02/14/2011   PLT 229 02/14/2011    Assessment / Plan: Induction of labor due to postterm,  progressing well on pitocin  Labor: Progressing on Pitocin, will continue to increase then AROM Fetal Wellbeing:  Category II Pain Control:  Epidural I/D:  PCN Anticipated MOD:  NSVD  HUNTER, STEPHEN 02/14/2011, 8:16 PM

## 2011-02-15 ENCOUNTER — Encounter (HOSPITAL_COMMUNITY): Payer: Self-pay

## 2011-02-15 DIAGNOSIS — O480 Post-term pregnancy: Secondary | ICD-10-CM

## 2011-02-15 MED ORDER — PRENATAL MULTIVITAMIN CH
1.0000 | ORAL_TABLET | Freq: Every day | ORAL | Status: DC
  Administered 2011-02-15 – 2011-02-16 (×2): 1 via ORAL
  Filled 2011-02-15 (×3): qty 1

## 2011-02-15 MED ORDER — ONDANSETRON HCL 4 MG/2ML IJ SOLN: 4.0000 mg | INTRAMUSCULAR | Status: DC | PRN

## 2011-02-15 MED ORDER — DIPHENHYDRAMINE HCL 25 MG PO CAPS: 25.0000 mg | ORAL_CAPSULE | Freq: Four times a day (QID) | ORAL | Status: DC | PRN

## 2011-02-15 MED ORDER — SIMETHICONE 80 MG PO CHEW: 80.0000 mg | CHEWABLE_TABLET | ORAL | Status: DC | PRN

## 2011-02-15 MED ORDER — BENZOCAINE-MENTHOL 20-0.5 % EX AERO
INHALATION_SPRAY | CUTANEOUS | Status: AC
  Administered 2011-02-15: 07:00:00
  Filled 2011-02-15: qty 56

## 2011-02-15 MED ORDER — LANOLIN HYDROUS EX OINT: TOPICAL_OINTMENT | CUTANEOUS | Status: DC | PRN

## 2011-02-15 MED ORDER — DIBUCAINE 1 % RE OINT: 1.0000 "application " | TOPICAL_OINTMENT | RECTAL | Status: DC | PRN

## 2011-02-15 MED ORDER — TETANUS-DIPHTH-ACELL PERTUSSIS 5-2.5-18.5 LF-MCG/0.5 IM SUSP: 0.5000 mL | Freq: Once | INTRAMUSCULAR | Status: DC

## 2011-02-15 MED ORDER — SENNOSIDES-DOCUSATE SODIUM 8.6-50 MG PO TABS
2.0000 | ORAL_TABLET | Freq: Every day | ORAL | Status: DC
  Administered 2011-02-15 – 2011-02-16 (×2): 2 via ORAL

## 2011-02-15 MED ORDER — BENZOCAINE-MENTHOL 20-0.5 % EX AERO
1.0000 "application " | INHALATION_SPRAY | CUTANEOUS | Status: DC | PRN
  Administered 2011-02-15: 1 via TOPICAL

## 2011-02-15 MED ORDER — OXYCODONE-ACETAMINOPHEN 5-325 MG PO TABS
1.0000 | ORAL_TABLET | ORAL | Status: DC | PRN
  Administered 2011-02-15: 1 via ORAL
  Administered 2011-02-15: 2 via ORAL
  Administered 2011-02-15 – 2011-02-16 (×2): 1 via ORAL
  Administered 2011-02-17: 2 via ORAL
  Filled 2011-02-15: qty 1
  Filled 2011-02-15: qty 2
  Filled 2011-02-15: qty 1
  Filled 2011-02-15: qty 2
  Filled 2011-02-15: qty 1

## 2011-02-15 MED ORDER — WITCH HAZEL-GLYCERIN EX PADS: 1.0000 "application " | MEDICATED_PAD | CUTANEOUS | Status: DC | PRN

## 2011-02-15 MED ORDER — IBUPROFEN 600 MG PO TABS
600.0000 mg | ORAL_TABLET | Freq: Four times a day (QID) | ORAL | Status: DC
  Administered 2011-02-15 – 2011-02-17 (×10): 600 mg via ORAL
  Filled 2011-02-15 (×9): qty 1

## 2011-02-15 MED ORDER — ONDANSETRON HCL 4 MG PO TABS: 4.0000 mg | ORAL_TABLET | ORAL | Status: DC | PRN

## 2011-02-15 MED ORDER — ZOLPIDEM TARTRATE 5 MG PO TABS: 5.0000 mg | ORAL_TABLET | Freq: Every evening | ORAL | Status: DC | PRN

## 2011-02-15 NOTE — H&P (Signed)
Agree with above note.  Arias Weinert 02/15/2011 1:32 PM   

## 2011-02-15 NOTE — Significant Event (Signed)
Unable to chart on delivery record locked out. Charted on paper copy of delivery record

## 2011-02-15 NOTE — Progress Notes (Signed)
UR Chart review completed.  

## 2011-02-15 NOTE — Anesthesia Postprocedure Evaluation (Signed)
  Anesthesia Post Note  Patient: Sandra Oliver  Procedure(s) Performed: * No procedures listed *  Anesthesia type: Epidural  Patient location: Mother/Baby  Post pain: Pain level controlled  Post assessment: Post-op Vital signs reviewed  Last Vitals:  Filed Vitals:   02/15/11 1509  BP: 116/70  Pulse: 98  Temp: 36.7 C  Resp: 18    Post vital signs: Reviewed  Level of consciousness:alert  Complications: No apparent anesthesia complications

## 2011-02-16 NOTE — Progress Notes (Signed)
Post Partum Day 1  Subjective: no complaints, up ad lib, voiding and tolerating PO, small lochia, plans to breastfeed, bilateral tubal ligation  Objective: Blood pressure 115/68, pulse 77, temperature 97.4 F (36.3 C), temperature source Oral, resp. rate 18, height 5\' 2"  (1.575 m), weight 96.616 kg (213 lb), last menstrual period 05/02/2010, SpO2 100.00%, unknown if currently breastfeeding.  Physical Exam:  General: alert, cooperative and no distress Lochia:normal flow Chest: CTAB Heart: RRR no m/r/g Abdomen: +BS, soft, nontender,  Uterine Fundus: firm DVT Evaluation: No evidence of DVT seen on physical exam. Extremities: 2+ edema   Basename 02/14/11 0720  HGB 12.3  HCT 36.3    Assessment/Plan: Plan for discharge tomorrow   LOS: 2 days   CRESENZO-DISHMAN,Jermani Pund 02/16/2011, 7:31 AM

## 2011-02-16 NOTE — Anesthesia Postprocedure Evaluation (Signed)
Anesthesia Post Note  Patient: Sandra Oliver  Procedure(s) Performed: * No procedures listed *  Anesthesia type: Epidural  Patient location: Mother/Baby  Post pain: Pain level controlled  Post assessment: Post-op Vital signs reviewed  Last Vitals:  Filed Vitals:   02/16/11 0538  BP: 115/68  Pulse: 77  Temp: 36.3 C  Resp: 18    Post vital signs: Reviewed  Level of consciousness: awake  Complications: No apparent anesthesia complications

## 2011-02-17 MED ORDER — IBUPROFEN 600 MG PO TABS: 600.0000 mg | ORAL_TABLET | Freq: Four times a day (QID) | ORAL | Status: AC

## 2011-02-17 NOTE — Progress Notes (Signed)
Post Partum Day #2 Subjective: no complaints and up ad lib; breastfeeding going well; desires outpt BTL (papers signed <30d ago) and may want Depo prior to procedure  Objective: Blood pressure 97/63, pulse 76, temperature 98.1 F (36.7 C), temperature source Oral, resp. rate 18, height 5\' 2"  (1.575 m), weight 96.616 kg (213 lb), last menstrual period 05/02/2010, SpO2 100.00%, unknown if currently breastfeeding.  Physical Exam:  General: alert, cooperative and no distress Lochia: appropriate Uterine Fundus: firm DVT Evaluation: No evidence of DVT seen on physical exam. Calf/Ankle edema is present.  No results found for this basename: HGB:2,HCT:2 in the last 72 hours  Assessment/Plan: Discharge home and Breastfeeding Rx Motrin given F/U appt sched for [facility] on 1/31 for preop and PP visit Inst to call [facility] for Depo appt prior if desires   LOS: 3 days   Cam Hai 02/17/2011, 11:47 AM

## 2011-02-17 NOTE — Discharge Summary (Signed)
Obstetric Discharge Summary Reason for Admission: induction of labor Prenatal Procedures: none Intrapartum Procedures: spontaneous vaginal delivery Postpartum Procedures: none Complications-Operative and Postpartum: none Hemoglobin  Date Value Range Status  02/14/2011 12.3  12.0-15.0 (g/dL) Final     HCT  Date Value Range Status  02/14/2011 36.3  36.0-46.0 (%) Final    Discharge Diagnoses: Term Pregnancy-delivered  Discharge Information: Date: 02/17/2011 Activity: pelvic rest Diet: routine Medications: PNV and Ibuprofen Condition: stable Instructions: refer to practice specific booklet Discharge to: home Follow-up Information    Follow up with [facility]. (Appointment scheduled for 03/22/11)    Contact information:   [address] [place] [address]          Newborn Data: Live born female  Birth Weight: 8 lb (3629 g) APGAR: 8, 9  Home with mother.  Cam Hai 02/17/2011, 11:51 AM

## 2011-02-20 DIAGNOSIS — M545 Low back pain, unspecified: Secondary | ICD-10-CM

## 2011-02-20 HISTORY — DX: Low back pain, unspecified: M54.50

## 2011-02-28 DIAGNOSIS — Z3480 Encounter for supervision of other normal pregnancy, unspecified trimester: Secondary | ICD-10-CM

## 2011-03-22 ENCOUNTER — Encounter: Payer: Self-pay | Admitting: Advanced Practice Midwife

## 2011-03-22 ENCOUNTER — Ambulatory Visit (INDEPENDENT_AMBULATORY_CARE_PROVIDER_SITE_OTHER): Payer: Medicaid Other | Admitting: Advanced Practice Midwife

## 2011-03-22 DIAGNOSIS — Z30013 Encounter for initial prescription of injectable contraceptive: Secondary | ICD-10-CM

## 2011-03-22 DIAGNOSIS — Z3009 Encounter for other general counseling and advice on contraception: Secondary | ICD-10-CM

## 2011-03-22 DIAGNOSIS — Z309 Encounter for contraceptive management, unspecified: Secondary | ICD-10-CM

## 2011-03-22 DIAGNOSIS — R42 Dizziness and giddiness: Secondary | ICD-10-CM

## 2011-03-22 DIAGNOSIS — Z3049 Encounter for surveillance of other contraceptives: Secondary | ICD-10-CM

## 2011-03-22 LAB — CBC WITH DIFFERENTIAL/PLATELET
Basophils Relative: 1 % (ref 0–1)
Eosinophils Absolute: 0.2 10*3/uL (ref 0.0–0.7)
Eosinophils Relative: 2 % (ref 0–5)
Hemoglobin: 13.3 g/dL (ref 12.0–15.0)
Lymphs Abs: 2.9 10*3/uL (ref 0.7–4.0)
MCH: 29.4 pg (ref 26.0–34.0)
MCHC: 33.3 g/dL (ref 30.0–36.0)
MCV: 88.3 fL (ref 78.0–100.0)
Monocytes Relative: 6 % (ref 3–12)
Neutrophils Relative %: 57 % (ref 43–77)
Platelets: 265 10*3/uL (ref 150–400)
RBC: 4.53 MIL/uL (ref 3.87–5.11)

## 2011-03-22 MED ORDER — MEDROXYPROGESTERONE ACETATE 150 MG/ML IM SUSP
150.0000 mg | Freq: Once | INTRAMUSCULAR | Status: AC
  Administered 2011-03-22: 150 mg via INTRAMUSCULAR

## 2011-03-22 NOTE — Progress Notes (Unsigned)
Subjective:     Sandra Oliver is a 31 y.o. female who presents for a postpartum visit. She is 5 weeks postpartum following a spontaneous vaginal delivery. I have fully reviewed the prenatal and intrapartum course. The delivery was at 41.2 gestational weeks. Outcome: spontaneous vaginal delivery. Anesthesia: epidural. Postpartum course has been normal except for new symptoms of dizziness w/ position changes (turning head, sitting to lying for the past two weeks. She denies URI Sx or ear pain. Lochia has been light and has almost ceased. Baby's course has been normal. Baby is feeding by breast. Bleeding staining only. Bowel function is normal. Bladder function is normal. Patient is not sexually active. Contraception method is desired Essure adn requests Depo today. Postpartum depression screening: {neg default:13464::"negative"}.  {Common ambulatory SmartLinks:19316}  Review of Systems {ros; complete:30496}   Objective:    BP 109/72  Pulse 67  Temp(Src) 98.2 F (36.8 C) (Oral)  Ht 5' 2.5" (1.588 m)  Wt 182 lb (82.555 kg)  BMI 32.76 kg/m2  Breastfeeding? Yes  General:  {gen appearance:16600}   Breasts:  {breast exam:1202::"inspection negative, no nipple discharge or bleeding, no masses or nodularity palpable"}  Lungs: {lung exam:16931}  Heart:  {heart exam:5510}  Abdomen: {abdomen exam:16834}   Vulva:  {labia exam:12198}  Vagina: {vagina exam:12200}  Cervix:  {cervix exam:14595}  Corpus: {uterus exam:12215}  Adnexa:  {adnexa exam:12223}  Rectal Exam: {rectal/vaginal exam:12274}        Assessment:    *** postpartum exam. Pap smear {done:10129} at today's visit.   Plan:    1. Contraception: {method:5051} 2. *** 3. Follow up in: {1-10:13787} {time; units:19136} or as needed.

## 2011-03-22 NOTE — Patient Instructions (Signed)
Sterilization, Women Sterilization is a surgical procedure. This surgery permanently prevents pregnancy in women. This can be done by tying (with or without cutting) the fallopian tubes or burning the tubes closed (tubal ligation). Tubal ligation blocks the tubes and prevents the egg from being fertilized by the sperm. Sterilization can be done by removing the ovaries that produce the egg (castration) as well. Sterilization is considered safe with very rare complications. It does not affect menstrual periods, sexual desire, or performance.  Since sterilization is considered permanent, you should not do it until you are sure you do not want to have more children. You and your partner should fully agree to have the procedure. Your decision to have the procedure should not be made when you are in a stressful situation. This can include a loss of a pregnancy, illness or death of a spouse, or divorce. There are other means of preventing unwanted pregnancies that can be used until you are completely sure you want to be sterilized. Sterilization does not protect against sexually transmitted disease. Women who had a sterilization procedure and want it reversed must know that it requires an expensive and major operation. The reversal may not be successful and has a high rate of tubal (ectopic) pregnancy that can be dangerous and require surgery. There are several ways to perform a tubal sterlization:  Laparoscopy. The abdomen is filled with a gas to see the pelvic organs. Then, a tube with a light attached is inserted into the abdomen through 2 small incisions. The fallopian tubes are blocked with a ring, clip or electrocautery to burn closed the tubes. Then, the gas is released and the small incisions are closed.   Hysteroscopy. A tube with a light is inserted in the vagina, through the cervix and then into the uterus. A spring-like instrument is inserted into the opening of the fallopian tubes. The spring causes  scaring and blocks the tubes. Other forms of contraception should be used for three months at which time an X-ray is done to be sure the tubes are blocked.   Minilaparotomy. This is done right after giving birth. A small incision is made under the belly button and the tubes are exposed. The tubes can then be burned, tied and/or cut.   Tubal ligation can be done during a Cesarean section.   Castration is a surgical procedure that removes both ovaries.  Tubal sterilization should be discussed with your caregiver to answer any concerns you or your partner might have. This meeting will help to decide for sure if the operation is safe for you and which procedure is the best one for you. You can change your mind and cancel the surgery at any time. HOME CARE INSTRUCTIONS   Follow your caregivers instructions regarding diet, rest, work, social and sexual activities and follow up appointments.   Shoulder pain is common following a laparoscopy. The pain may be relieved by lying down flat.   Only take over-the-counter or prescription medicines for pain, discomfort or fever as directed by your caregiver.   You may use lozenges for throat discomfort.   Keep the incisions covered to prevent infection.  SEEK IMMEDIATE MEDICAL CARE IF:   You develop a temperature of 102 F (38.9 C), or as your caregiver suggests.   You become dizzy or faint.   You start to feel sick to your stomach (nausea) or throw up (vomit).   You develop abdominal pain not relieved with over-the-counter medications.   You have redness and puffiness (  swelling) of the cut (incision).   You see pus draining from the incision.   You miss a menstrual period.  Document Released: 07/25/2007 Document Revised: 10/18/2010 Document Reviewed: 07/25/2007 [employer] Patient Information 2012 [employer]. 

## 2011-03-23 ENCOUNTER — Other Ambulatory Visit (HOSPITAL_COMMUNITY)
Admission: RE | Admit: 2011-03-23 | Discharge: 2011-03-23 | Disposition: A | Discharge status: Home / Self Care | Payer: Medicaid Other | Source: Ambulatory Visit | Attending: Obstetrics & Gynecology | Admitting: Obstetrics & Gynecology

## 2011-03-23 ENCOUNTER — Ambulatory Visit (INDEPENDENT_AMBULATORY_CARE_PROVIDER_SITE_OTHER): Payer: Medicaid Other | Admitting: Obstetrics and Gynecology

## 2011-03-23 ENCOUNTER — Encounter: Payer: Self-pay | Admitting: Obstetrics and Gynecology

## 2011-03-23 VITALS — BP 96/66 | HR 67 | Temp 97.2°F | Ht 62.5 in | Wt 181.7 lb

## 2011-03-23 DIAGNOSIS — R87610 Atypical squamous cells of undetermined significance on cytologic smear of cervix (ASC-US): Secondary | ICD-10-CM

## 2011-03-23 DIAGNOSIS — R87619 Unspecified abnormal cytological findings in specimens from cervix uteri: Secondary | ICD-10-CM | POA: Insufficient documentation

## 2011-03-23 HISTORY — PX: COLPOSCOPY: SHX161

## 2011-03-23 NOTE — Progress Notes (Signed)
S: Pt presents today for postpartum colposcopy. She states she is doing well and has no complaints. Her pap was done in 06/2010 and showed ASCUS. O: VSS A&O x 3 in NAD Pre-procedure time-out was performed. NL external genitalia Speculum exam performed. Cervix easily visualized. Acetic acid was placed on cervix. Positive acetowhite area at 1 o'clock. A vigorous ECC was obtained along with biopsy at 1 o'clock. Monsel's solution was applied to the cervix and adequate hemostasis was obtained. Pt tolerated this procedure well. A/P: Hx of ASCUS with + HR HPV: pt will f/u in 2 wks to discuss results. Discussed diet, activity, risks, and precuations.  Clinton Gallant. Rice III, DrHSc, MPAS, PA-C

## 2011-03-23 NOTE — Progress Notes (Signed)
Addended by: Faythe Casa on: 03/23/2011 11:56 AM   Modules accepted: Orders

## 2011-03-26 DIAGNOSIS — Z3480 Encounter for supervision of other normal pregnancy, unspecified trimester: Secondary | ICD-10-CM

## 2011-04-04 ENCOUNTER — Ambulatory Visit (INDEPENDENT_AMBULATORY_CARE_PROVIDER_SITE_OTHER): Payer: Medicaid Other | Admitting: Obstetrics & Gynecology

## 2011-04-04 ENCOUNTER — Encounter: Payer: Self-pay | Admitting: Obstetrics & Gynecology

## 2011-04-04 VITALS — BP 117/61 | Temp 98.0°F | Resp 70 | Ht 62.5 in | Wt 181.9 lb

## 2011-04-04 DIAGNOSIS — Z302 Encounter for sterilization: Secondary | ICD-10-CM

## 2011-04-04 DIAGNOSIS — Z01818 Encounter for other preprocedural examination: Secondary | ICD-10-CM

## 2011-04-04 NOTE — Progress Notes (Signed)
  Subjective:    Patient ID: Sandra Oliver, female    DOB: 1980/04/08, 31 y.o.   MRN: [id number]  [id number] No LMP recorded. Still bleeding lightly after delivery 7 weeks ago. Wants to schedule sterilization via Essure. The procedure and the risk of failure, ectopic, bleeding , infection, pelvic organ damage were discussed and her questions were answered.  She was told another surgeon will be scheduled to do the operation. Past Medical History  Diagnosis Date  . Kidney infection    Past Surgical History  Procedure Date  . Foot surgery 2001    bunion removal   Scheduled Meds:   Continuous Infusions:   PRN Meds:.   Current outpatient prescriptions:Prenatal Vit-Fe Fumarate-FA (MULTIVITAMIN-PRENATAL) 27-0.8 MG TABS, Take 1 tablet by mouth daily.  , Disp: , Rfl:  Depo Provera 03/22/11  No Known Allergies History   Social History  . Marital Status: Single    Spouse Name: N/A    Number of Children: N/A  . Years of Education: N/A   Occupational History  . Not on file.   Social History Main Topics  . Smoking status: Never Smoker   . Smokeless tobacco: Never Used  . Alcohol Use: No  . Drug Use: No  . Sexually Active: Yes    Birth Control/ Protection: None   Other Topics Concern  . Not on file   Social History Narrative  . No narrative on file    Review of Systems Vaginal bleeding, no pain.   Objective:   Physical Exam Filed Vitals:   04/04/11 1301  BP: 117/61  Temp: 98 F (36.7 C)  Resp: 70  Height: 5' 2.5" (1.588 m)  Weight: 181 lb 14.4 oz (82.509 kg)   NAD, pleasant Exam deferred ASCUS + HR HPV Biopsies 03/22/11 benign, no dysplasia     Assessment & Plan:  Desires sterilization, will schedule Essure by her request F/U pap in 6 months [name] 04/04/11

## 2011-04-04 NOTE — Patient Instructions (Signed)
Sterilization, Women Sterilization is a surgical procedure. This surgery permanently prevents pregnancy in women. This can be done by tying (with or without cutting) the fallopian tubes or burning the tubes closed (tubal ligation). Tubal ligation blocks the tubes and prevents the egg from being fertilized by the sperm. Sterilization can be done by removing the ovaries that produce the egg (castration) as well. Sterilization is considered safe with very rare complications. It does not affect menstrual periods, sexual desire, or performance.  Since sterilization is considered permanent, you should not do it until you are sure you do not want to have more children. You and your partner should fully agree to have the procedure. Your decision to have the procedure should not be made when you are in a stressful situation. This can include a loss of a pregnancy, illness or death of a spouse, or divorce. There are other means of preventing unwanted pregnancies that can be used until you are completely sure you want to be sterilized. Sterilization does not protect against sexually transmitted disease. Women who had a sterilization procedure and want it reversed must know that it requires an expensive and major operation. The reversal may not be successful and has a high rate of tubal (ectopic) pregnancy that can be dangerous and require surgery. There are several ways to perform a tubal sterlization:  Laparoscopy. The abdomen is filled with a gas to see the pelvic organs. Then, a tube with a light attached is inserted into the abdomen through 2 small incisions. The fallopian tubes are blocked with a ring, clip or electrocautery to burn closed the tubes. Then, the gas is released and the small incisions are closed.   Hysteroscopy. A tube with a light is inserted in the vagina, through the cervix and then into the uterus. A spring-like instrument is inserted into the opening of the fallopian tubes. The spring causes  scaring and blocks the tubes. Other forms of contraception should be used for three months at which time an X-ray is done to be sure the tubes are blocked.   Minilaparotomy. This is done right after giving birth. A small incision is made under the belly button and the tubes are exposed. The tubes can then be burned, tied and/or cut.   Tubal ligation can be done during a Cesarean section.   Castration is a surgical procedure that removes both ovaries.  Tubal sterilization should be discussed with your caregiver to answer any concerns you or your partner might have. This meeting will help to decide for sure if the operation is safe for you and which procedure is the best one for you. You can change your mind and cancel the surgery at any time. HOME CARE INSTRUCTIONS   Follow your caregivers instructions regarding diet, rest, work, social and sexual activities and follow up appointments.   Shoulder pain is common following a laparoscopy. The pain may be relieved by lying down flat.   Only take over-the-counter or prescription medicines for pain, discomfort or fever as directed by your caregiver.   You may use lozenges for throat discomfort.   Keep the incisions covered to prevent infection.  SEEK IMMEDIATE MEDICAL CARE IF:   You develop a temperature of 102 F (38.9 C), or as your caregiver suggests.   You become dizzy or faint.   You start to feel sick to your stomach (nausea) or throw up (vomit).   You develop abdominal pain not relieved with over-the-counter medications.   You have redness and puffiness (  swelling) of the cut (incision).   You see pus draining from the incision.   You miss a menstrual period.  Document Released: 07/25/2007 Document Revised: 10/18/2010 Document Reviewed: 07/25/2007 [employer] Patient Information 2012 [employer]. 

## 2011-04-05 ENCOUNTER — Other Ambulatory Visit: Payer: Self-pay | Admitting: Obstetrics & Gynecology

## 2011-04-18 ENCOUNTER — Encounter (HOSPITAL_COMMUNITY): Payer: Self-pay | Admitting: *Deleted

## 2011-04-26 ENCOUNTER — Encounter (HOSPITAL_COMMUNITY): Payer: Self-pay

## 2011-05-11 ENCOUNTER — Emergency Department (HOSPITAL_COMMUNITY)
Admission: EM | Admit: 2011-05-11 | Discharge: 2011-05-11 | Disposition: A | Discharge status: Home / Self Care | Payer: No Typology Code available for payment source | Attending: Emergency Medicine | Admitting: Emergency Medicine

## 2011-05-11 ENCOUNTER — Encounter (HOSPITAL_COMMUNITY): Payer: Self-pay | Admitting: *Deleted

## 2011-05-11 DIAGNOSIS — M542 Cervicalgia: Secondary | ICD-10-CM | POA: Insufficient documentation

## 2011-05-11 DIAGNOSIS — Y92410 Unspecified street and highway as the place of occurrence of the external cause: Secondary | ICD-10-CM | POA: Insufficient documentation

## 2011-05-11 DIAGNOSIS — M545 Low back pain, unspecified: Secondary | ICD-10-CM | POA: Insufficient documentation
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
## 2011-05-14 ENCOUNTER — Ambulatory Visit (HOSPITAL_COMMUNITY)
Admission: RE | Admit: 2011-05-14 | Discharge: 2011-05-14 | Disposition: A | Discharge status: Home / Self Care | Payer: Medicaid Other | Source: Ambulatory Visit | Attending: Obstetrics and Gynecology | Admitting: Obstetrics and Gynecology

## 2011-05-14 ENCOUNTER — Encounter (HOSPITAL_COMMUNITY)
Admission: RE | Disposition: A | Discharge status: Home / Self Care | Payer: Self-pay | Source: Ambulatory Visit | Attending: Obstetrics and Gynecology

## 2011-05-14 ENCOUNTER — Encounter (HOSPITAL_COMMUNITY): Payer: Self-pay | Admitting: Anesthesiology

## 2011-05-14 DIAGNOSIS — Z5309 Procedure and treatment not carried out because of other contraindication: Secondary | ICD-10-CM | POA: Insufficient documentation

## 2011-05-14 HISTORY — DX: Low back pain, unspecified: M54.50

## 2011-05-14 HISTORY — DX: Headache: R51

## 2011-05-14 HISTORY — DX: Low back pain: M54.5

## 2011-05-14 LAB — CBC
HCT: 40.3 % (ref 36.0–46.0)
MCHC: 33.7 g/dL (ref 30.0–36.0)
MCV: 88 fL (ref 78.0–100.0)
RDW: 13.3 % (ref 11.5–15.5)

## 2011-05-14 LAB — PREGNANCY, URINE: Preg Test, Ur: NEGATIVE

## 2011-05-14 MED ORDER — LACTATED RINGERS IV SOLN: INTRAVENOUS | Status: DC

## 2011-05-14 MED ORDER — KETOROLAC TROMETHAMINE 30 MG/ML IJ SOLN: 30.0000 mg | Freq: Once | INTRAMUSCULAR | Status: DC
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
## 2011-05-15 ENCOUNTER — Encounter (HOSPITAL_COMMUNITY): Payer: Self-pay | Admitting: Obstetrics and Gynecology

## 2011-05-15 NOTE — ED Provider Notes (Signed)
Medical screening examination/treatment/procedure(s) were performed by non-physician practitioner and as supervising physician I was immediately available for consultation/collaboration.   Gavin Pound. Oletta Lamas, MD 05/15/11 [phone]

## 2011-05-31 ENCOUNTER — Ambulatory Visit: Payer: Medicaid Other | Attending: Orthopedic Surgery | Admitting: Physical Therapy

## 2011-05-31 DIAGNOSIS — M546 Pain in thoracic spine: Secondary | ICD-10-CM | POA: Insufficient documentation

## 2011-05-31 DIAGNOSIS — M545 Low back pain, unspecified: Secondary | ICD-10-CM | POA: Insufficient documentation

## 2011-05-31 DIAGNOSIS — M542 Cervicalgia: Secondary | ICD-10-CM | POA: Insufficient documentation

## 2011-05-31 DIAGNOSIS — IMO0001 Reserved for inherently not codable concepts without codable children: Secondary | ICD-10-CM | POA: Insufficient documentation

## 2011-05-31 DIAGNOSIS — M2569 Stiffness of other specified joint, not elsewhere classified: Secondary | ICD-10-CM | POA: Insufficient documentation

## 2011-06-04 ENCOUNTER — Ambulatory Visit: Payer: Medicaid Other | Admitting: Physical Therapy

## 2011-06-05 ENCOUNTER — Encounter: Payer: No Typology Code available for payment source | Admitting: Rehabilitative and Restorative Service Providers"

## 2011-06-07 ENCOUNTER — Encounter: Payer: No Typology Code available for payment source | Admitting: Physical Therapy

## 2011-06-12 ENCOUNTER — Encounter: Payer: No Typology Code available for payment source | Admitting: Physical Therapy

## 2011-06-14 ENCOUNTER — Ambulatory Visit: Payer: Medicaid Other | Admitting: Physical Therapy

## 2011-06-14 ENCOUNTER — Encounter (HOSPITAL_COMMUNITY): Payer: Self-pay | Admitting: *Deleted

## 2011-06-20 ENCOUNTER — Ambulatory Visit: Payer: No Typology Code available for payment source | Attending: Orthopedic Surgery | Admitting: Physical Therapy

## 2011-06-20 DIAGNOSIS — M545 Low back pain, unspecified: Secondary | ICD-10-CM | POA: Insufficient documentation

## 2011-06-20 DIAGNOSIS — IMO0001 Reserved for inherently not codable concepts without codable children: Secondary | ICD-10-CM | POA: Insufficient documentation

## 2011-06-20 DIAGNOSIS — M2569 Stiffness of other specified joint, not elsewhere classified: Secondary | ICD-10-CM | POA: Insufficient documentation

## 2011-06-20 DIAGNOSIS — M546 Pain in thoracic spine: Secondary | ICD-10-CM | POA: Insufficient documentation

## 2011-06-20 DIAGNOSIS — M542 Cervicalgia: Secondary | ICD-10-CM | POA: Insufficient documentation

## 2011-06-25 ENCOUNTER — Ambulatory Visit: Payer: No Typology Code available for payment source | Admitting: Physical Therapy

## 2011-06-27 ENCOUNTER — Encounter (HOSPITAL_COMMUNITY): Payer: Self-pay | Admitting: Pharmacist

## 2011-06-28 ENCOUNTER — Ambulatory Visit: Payer: No Typology Code available for payment source | Admitting: Physical Therapy

## 2011-07-02 ENCOUNTER — Ambulatory Visit: Payer: No Typology Code available for payment source | Admitting: Physical Therapy

## 2011-07-09 ENCOUNTER — Ambulatory Visit: Payer: No Typology Code available for payment source | Admitting: Physical Therapy

## 2011-07-10 ENCOUNTER — Encounter (HOSPITAL_COMMUNITY): Admission: RE | Payer: Self-pay | Source: Ambulatory Visit

## 2011-07-10 ENCOUNTER — Ambulatory Visit (HOSPITAL_COMMUNITY)
Admission: RE | Admit: 2011-07-10 | Payer: Medicaid Other | Source: Ambulatory Visit | Admitting: Obstetrics and Gynecology

## 2011-07-10 HISTORY — DX: Other seasonal allergic rhinitis: J30.2

## 2011-07-10 SURGERY — ESSURE TUBAL STERILIZATION
Anesthesia: Choice | Site: Vagina

## 2011-07-12 ENCOUNTER — Ambulatory Visit: Payer: No Typology Code available for payment source | Admitting: Physical Therapy

## 2011-07-17 ENCOUNTER — Encounter: Payer: Medicaid Other | Admitting: Physical Therapy

## 2011-07-17 ENCOUNTER — Ambulatory Visit: Payer: No Typology Code available for payment source | Admitting: Physical Therapy

## 2011-07-19 ENCOUNTER — Encounter: Payer: Medicaid Other | Admitting: Physical Therapy

## 2012-03-19 ENCOUNTER — Emergency Department (HOSPITAL_COMMUNITY)
Admission: EM | Admit: 2012-03-19 | Discharge: 2012-03-19 | Disposition: A | Discharge status: Home / Self Care | Payer: Medicaid Other | Source: Home / Self Care | Attending: Emergency Medicine | Admitting: Emergency Medicine

## 2012-03-19 ENCOUNTER — Emergency Department (INDEPENDENT_AMBULATORY_CARE_PROVIDER_SITE_OTHER): Payer: Medicaid Other

## 2012-03-19 ENCOUNTER — Encounter (HOSPITAL_COMMUNITY): Payer: Self-pay | Admitting: Emergency Medicine

## 2012-03-19 DIAGNOSIS — M542 Cervicalgia: Secondary | ICD-10-CM

## 2012-03-19 DIAGNOSIS — T148XXA Other injury of unspecified body region, initial encounter: Secondary | ICD-10-CM

## 2012-03-19 MED ORDER — MELOXICAM 7.5 MG PO TABS: 7.5000 mg | ORAL_TABLET | Freq: Every day | ORAL | Status: AC
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
## 2012-03-19 NOTE — ED Notes (Signed)
Pt is here for pain/stiffness of right side neck x2 weeks Saw her PCP for the same reason last week and dx w/muscle spasms Rx flexeril and tramadol w/no relief Pain is constant; unable to turn to the right; increases w/activity Recalls having this pain right after she was vomiting due to her usual migraines Denies: f/v/n/d  She is alert w/no signs of acute distress

## 2012-11-13 ENCOUNTER — Encounter (HOSPITAL_COMMUNITY): Payer: Self-pay | Admitting: *Deleted

## 2012-11-13 ENCOUNTER — Emergency Department (HOSPITAL_COMMUNITY)
Admission: EM | Admit: 2012-11-13 | Discharge: 2012-11-13 | Disposition: A | Discharge status: Home / Self Care | Payer: Medicaid Other | Attending: Emergency Medicine | Admitting: Emergency Medicine

## 2012-11-13 DIAGNOSIS — Z792 Long term (current) use of antibiotics: Secondary | ICD-10-CM | POA: Insufficient documentation

## 2012-11-13 DIAGNOSIS — Z3202 Encounter for pregnancy test, result negative: Secondary | ICD-10-CM | POA: Insufficient documentation

## 2012-11-13 DIAGNOSIS — N12 Tubulo-interstitial nephritis, not specified as acute or chronic: Secondary | ICD-10-CM

## 2012-11-13 LAB — URINALYSIS, ROUTINE W REFLEX MICROSCOPIC
Bilirubin Urine: NEGATIVE
Protein, ur: NEGATIVE mg/dL
Urobilinogen, UA: 0.2 mg/dL (ref 0.0–1.0)

## 2012-11-13 LAB — WET PREP, GENITAL: Clue Cells Wet Prep HPF POC: NONE SEEN

## 2012-11-13 LAB — URINE MICROSCOPIC-ADD ON

## 2012-11-13 MED ORDER — ONDANSETRON HCL 4 MG/2ML IJ SOLN
4.0000 mg | Freq: Once | INTRAMUSCULAR | Status: AC
  Administered 2012-11-13: 4 mg via INTRAVENOUS
  Filled 2012-11-13: qty 2

## 2012-11-13 MED ORDER — NITROFURANTOIN MONOHYD MACRO 100 MG PO CAPS: 100.0000 mg | ORAL_CAPSULE | Freq: Once | ORAL | Status: DC

## 2012-11-13 MED ORDER — MORPHINE SULFATE 4 MG/ML IJ SOLN
4.0000 mg | INTRAMUSCULAR | Status: DC | PRN
  Administered 2012-11-13 (×2): 4 mg via INTRAVENOUS
  Filled 2012-11-13 (×2): qty 1

## 2012-11-13 MED ORDER — NITROFURANTOIN MONOHYD MACRO 100 MG PO CAPS: 100.0000 mg | ORAL_CAPSULE | Freq: Two times a day (BID) | ORAL | Status: DC

## 2012-11-13 MED ORDER — HYDROCODONE-ACETAMINOPHEN 5-325 MG PO TABS: 1.0000 | ORAL_TABLET | Freq: Once | ORAL | Status: DC

## 2012-11-13 MED ORDER — ONDANSETRON HCL 4 MG PO TABS: 4.0000 mg | ORAL_TABLET | Freq: Four times a day (QID) | ORAL | Status: DC

## 2012-11-13 MED ORDER — HYDROCODONE-ACETAMINOPHEN 5-325 MG PO TABS: 1.0000 | ORAL_TABLET | ORAL | Status: DC | PRN

## 2012-11-13 MED ORDER — DEXTROSE 5 % IV SOLN
1.0000 g | Freq: Once | INTRAVENOUS | Status: AC
  Administered 2012-11-13: 1 g via INTRAVENOUS
  Filled 2012-11-13: qty 10
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
## 2012-11-13 NOTE — ED Notes (Signed)
Pt reports lower back pain x 2 weeks, started on left and now moved to right side. Having mild pain with urination. Denies fever/chills. Pt thinks she has UTI. Also wants to be checked for std, denies any symptoms but reports recent exposure. No acute distress noted at triag.e

## 2012-11-14 LAB — GC/CHLAMYDIA PROBE AMP: GC Probe RNA: NEGATIVE

## 2012-11-15 ENCOUNTER — Telehealth (HOSPITAL_COMMUNITY): Payer: Self-pay | Admitting: Emergency Medicine

## 2012-11-15 LAB — URINE CULTURE

## 2012-11-15 NOTE — ED Notes (Signed)
Patient has +Chlamydia. 

## 2012-11-15 NOTE — ED Notes (Signed)
+  Chlamydia. Chart sent to EDP office for review. DHHS attached. 

## 2012-11-16 ENCOUNTER — Telehealth (HOSPITAL_COMMUNITY): Payer: Self-pay | Admitting: Emergency Medicine

## 2012-11-16 NOTE — ED Notes (Signed)
Post ED Visit - Positive Culture Follow-up  Culture report reviewed by antimicrobial stewardship pharmacist: []  Wes Dulaney, Pharm.D., BCPS []  Celedonio Miyamoto, [address].D., BCPS [x]  Georgina Pillion, Pharm.D., BCPS []  [place], [place].D., BCPS, AAHIVP []  Estella Husk, Pharm.D., BCPS, AAHIVP  Positive urine culture Treated with Macrobid, organism sensitive to the same and no further patient follow-up is required at this time.  Kylie A Holland 11/16/2012, 6:22 PM

## 2012-11-19 ENCOUNTER — Telehealth (HOSPITAL_COMMUNITY): Payer: Self-pay | Admitting: Emergency Medicine

## 2012-11-19 NOTE — ED Notes (Signed)
Chart back from ED. Chart reviewed by T. Kirichenko PA "Zithromax 250 mg tab # 4, Take 1 gram po" Pt informed of Rx and Rx called to [facility] [phone].  DHHS form completed and faxed.

## 2012-11-19 NOTE — ED Notes (Signed)
ID verified x 2.  Pt following up regarding (+) chlamydia and need for Tx.  Unable to find chart, will check w/midlevel and call pt back.

## 2013-02-20 ENCOUNTER — Emergency Department (HOSPITAL_COMMUNITY)
Admission: EM | Admit: 2013-02-20 | Discharge: 2013-02-20 | Disposition: A | Discharge status: Home / Self Care | Payer: Medicaid Other | Attending: Emergency Medicine | Admitting: Emergency Medicine

## 2013-02-20 ENCOUNTER — Encounter (HOSPITAL_COMMUNITY): Payer: Self-pay | Admitting: Emergency Medicine

## 2013-02-20 DIAGNOSIS — Z87440 Personal history of urinary (tract) infections: Secondary | ICD-10-CM | POA: Insufficient documentation

## 2013-02-20 DIAGNOSIS — J029 Acute pharyngitis, unspecified: Secondary | ICD-10-CM | POA: Insufficient documentation

## 2013-02-20 DIAGNOSIS — R630 Anorexia: Secondary | ICD-10-CM | POA: Insufficient documentation

## 2013-02-20 DIAGNOSIS — Z8669 Personal history of other diseases of the nervous system and sense organs: Secondary | ICD-10-CM | POA: Insufficient documentation

## 2013-02-20 DIAGNOSIS — J309 Allergic rhinitis, unspecified: Secondary | ICD-10-CM | POA: Insufficient documentation

## 2013-02-20 DIAGNOSIS — R6883 Chills (without fever): Secondary | ICD-10-CM | POA: Insufficient documentation

## 2013-02-20 LAB — RAPID STREP SCREEN (MED CTR MEBANE ONLY): STREPTOCOCCUS, GROUP A SCREEN (DIRECT): NEGATIVE

## 2013-02-20 MED ORDER — IBUPROFEN 800 MG PO TABS
800.0000 mg | ORAL_TABLET | Freq: Once | ORAL | Status: AC
  Administered 2013-02-20: 800 mg via ORAL
  Filled 2013-02-20: qty 1

## 2013-02-20 MED ORDER — HYDROCODONE-ACETAMINOPHEN 7.5-325 MG/15ML PO SOLN: 10.0000 mL | Freq: Four times a day (QID) | ORAL | Status: DC | PRN

## 2013-02-20 MED ORDER — IBUPROFEN 600 MG PO TABS: 600.0000 mg | ORAL_TABLET | Freq: Three times a day (TID) | ORAL | Status: DC | PRN
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
## 2013-02-20 NOTE — Discharge Instructions (Signed)

## 2013-02-20 NOTE — ED Notes (Signed)
Presents with 2 days of sore throat, denies fever. Pain is worse with swallowing. Throat red.

## 2013-02-21 LAB — CULTURE, GROUP A STREP

## 2013-07-29 ENCOUNTER — Encounter (HOSPITAL_COMMUNITY): Payer: Self-pay | Admitting: Emergency Medicine

## 2013-07-29 ENCOUNTER — Emergency Department (HOSPITAL_COMMUNITY)
Admission: EM | Admit: 2013-07-29 | Discharge: 2013-07-29 | Disposition: A | Discharge status: Home / Self Care | Payer: BC Managed Care – PPO | Attending: Emergency Medicine | Admitting: Emergency Medicine

## 2013-07-29 DIAGNOSIS — G8929 Other chronic pain: Secondary | ICD-10-CM | POA: Insufficient documentation

## 2013-07-29 DIAGNOSIS — Z87448 Personal history of other diseases of urinary system: Secondary | ICD-10-CM | POA: Insufficient documentation

## 2013-07-29 DIAGNOSIS — M79609 Pain in unspecified limb: Secondary | ICD-10-CM | POA: Insufficient documentation

## 2013-07-29 DIAGNOSIS — M79606 Pain in leg, unspecified: Secondary | ICD-10-CM

## 2013-07-29 DIAGNOSIS — M549 Dorsalgia, unspecified: Secondary | ICD-10-CM | POA: Insufficient documentation

## 2013-07-29 DIAGNOSIS — IMO0001 Reserved for inherently not codable concepts without codable children: Secondary | ICD-10-CM | POA: Insufficient documentation

## 2013-07-29 MED ORDER — OXYCODONE-ACETAMINOPHEN 5-325 MG PO TABS: 1.0000 | ORAL_TABLET | ORAL | Status: DC | PRN

## 2013-07-29 NOTE — ED Provider Notes (Signed)
Medical screening examination/treatment/procedure(s) were performed by non-physician practitioner and as supervising physician I was immediately available for consultation/collaboration.   Tadan Shill M Jamyson Jirak, MD 07/29/13 1705 

## 2013-07-29 NOTE — ED Notes (Signed)
Pt presents to department for evaluation of R leg pain. Also states back pain radiating to R leg. Ongoing for several years. Pt states leg became swollen this morning. Also states tingling to R foot. Pt is alert and oriented x4.

## 2013-07-29 NOTE — Discharge Instructions (Signed)
Take the prescribed medication as directed. Continue elevating feet at home to help with swelling.  May wish to wear compression hose at work to reduce swelling throughout the day. Make sure you are wearing shoes with lots of support. Follow-up with your primary care physician. Return to the ED for new or worsening symptoms.
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
## 2013-08-11 ENCOUNTER — Emergency Department (HOSPITAL_COMMUNITY)
Admission: EM | Admit: 2013-08-11 | Discharge: 2013-08-11 | Disposition: A | Discharge status: Home / Self Care | Payer: BC Managed Care – PPO | Attending: Emergency Medicine | Admitting: Emergency Medicine

## 2013-08-11 ENCOUNTER — Encounter (HOSPITAL_COMMUNITY): Payer: Self-pay | Admitting: Emergency Medicine

## 2013-08-11 DIAGNOSIS — M545 Low back pain, unspecified: Secondary | ICD-10-CM | POA: Insufficient documentation

## 2013-08-11 DIAGNOSIS — Z79899 Other long term (current) drug therapy: Secondary | ICD-10-CM | POA: Insufficient documentation

## 2013-08-11 DIAGNOSIS — J029 Acute pharyngitis, unspecified: Secondary | ICD-10-CM | POA: Insufficient documentation

## 2013-08-11 DIAGNOSIS — R6883 Chills (without fever): Secondary | ICD-10-CM | POA: Insufficient documentation

## 2013-08-11 DIAGNOSIS — R51 Headache: Secondary | ICD-10-CM | POA: Insufficient documentation

## 2013-08-11 DIAGNOSIS — Z87448 Personal history of other diseases of urinary system: Secondary | ICD-10-CM | POA: Insufficient documentation

## 2013-08-11 DIAGNOSIS — M542 Cervicalgia: Secondary | ICD-10-CM | POA: Insufficient documentation

## 2013-08-11 LAB — URINALYSIS, ROUTINE W REFLEX MICROSCOPIC
Bilirubin Urine: NEGATIVE
GLUCOSE, UA: NEGATIVE mg/dL
Hgb urine dipstick: NEGATIVE
Ketones, ur: NEGATIVE mg/dL
Nitrite: NEGATIVE
PROTEIN: NEGATIVE mg/dL
SPECIFIC GRAVITY, URINE: 1.022 (ref 1.005–1.030)
UROBILINOGEN UA: 1 mg/dL (ref 0.0–1.0)
pH: 6.5 (ref 5.0–8.0)

## 2013-08-11 LAB — URINE MICROSCOPIC-ADD ON

## 2013-08-11 LAB — RAPID STREP SCREEN (MED CTR MEBANE ONLY): STREPTOCOCCUS, GROUP A SCREEN (DIRECT): NEGATIVE

## 2013-08-11 LAB — PREGNANCY, URINE: PREG TEST UR: NEGATIVE

## 2013-08-11 LAB — MONONUCLEOSIS SCREEN: MONO SCREEN: NEGATIVE

## 2013-08-11 MED ORDER — PENICILLIN G BENZATHINE 1200000 UNIT/2ML IM SUSP
1.2000 10*6.[IU] | Freq: Once | INTRAMUSCULAR | Status: AC
  Administered 2013-08-11: 1.2 10*6.[IU] via INTRAMUSCULAR
  Filled 2013-08-11: qty 2

## 2013-08-11 MED ORDER — HYDROCODONE-ACETAMINOPHEN 7.5-325 MG/15ML PO SOLN: 15.0000 mL | Freq: Three times a day (TID) | ORAL | Status: DC | PRN

## 2013-08-11 MED ORDER — GI COCKTAIL ~~LOC~~
30.0000 mL | Freq: Once | ORAL | Status: AC
  Administered 2013-08-11: 30 mL via ORAL
  Filled 2013-08-11: qty 30

## 2013-08-11 MED ORDER — TRAMADOL HCL 50 MG PO TABS: 50.0000 mg | ORAL_TABLET | Freq: Four times a day (QID) | ORAL | Status: DC | PRN

## 2013-08-11 MED ORDER — IBUPROFEN 800 MG PO TABS
800.0000 mg | ORAL_TABLET | Freq: Once | ORAL | Status: AC
  Administered 2013-08-11: 800 mg via ORAL
  Filled 2013-08-11: qty 1

## 2013-08-11 MED ORDER — DEXAMETHASONE SODIUM PHOSPHATE 10 MG/ML IJ SOLN
10.0000 mg | Freq: Once | INTRAMUSCULAR | Status: AC
  Administered 2013-08-11: 10 mg via INTRAMUSCULAR
  Filled 2013-08-11: qty 1

## 2013-08-11 NOTE — Discharge Instructions (Signed)
Strep Throat  Strep throat is an infection of the throat caused by a bacteria named Streptococcus pyogenes. Your caregiver may call the infection streptococcal "tonsillitis" or "pharyngitis" depending on whether there are signs of inflammation in the tonsils or back of the throat. Strep throat is most common in children aged 31-15 years during the cold months of the year, but it can occur in people of any age during any season. This infection is spread from person to person (contagious) through coughing, sneezing, or other close contact.  SYMPTOMS   · Fever or chills.  · Painful, swollen, red tonsils or throat.  · Pain or difficulty when swallowing.  · White or yellow spots on the tonsils or throat.  · Swollen, tender lymph nodes or "glands" of the neck or under the jaw.  · Red rash all over the body (rare).  DIAGNOSIS   Many different infections can cause the same symptoms. A test must be done to confirm the diagnosis so the right treatment can be given. A "rapid strep test" can help your caregiver make the diagnosis in a few minutes. If this test is not available, a light swab of the infected area can be used for a throat culture test. If a throat culture test is done, results are usually available in a day or two.  TREATMENT   Strep throat is treated with antibiotic medicine.  HOME CARE INSTRUCTIONS   · Gargle with 1 tsp of salt in 1 cup of warm water, 3-4 times per day or as needed for comfort.  · Family members who also have a sore throat or fever should be tested for strep throat and treated with antibiotics if they have the strep infection.  · Make sure everyone in your household washes their hands well.  · Do not share food, drinking cups, or personal items that could cause the infection to spread to others.  · You may need to eat a soft food diet until your sore throat gets better.  · Drink enough water and fluids to keep your urine clear or pale yellow. This will help prevent dehydration.  · Get plenty of  rest.  · Stay home from school, daycare, or work until you have been on antibiotics for 24 hours.  · Only take over-the-counter or prescription medicines for pain, discomfort, or fever as directed by your caregiver.  · If antibiotics are prescribed, take them as directed. Finish them even if you start to feel better.  SEEK MEDICAL CARE IF:   · The glands in your neck continue to enlarge.  · You develop a rash, cough, or earache.  · You cough up green, yellow-brown, or bloody sputum.  · You have pain or discomfort not controlled by medicines.  · Your problems seem to be getting worse rather than better.  SEEK IMMEDIATE MEDICAL CARE IF:   · You develop any new symptoms such as vomiting, severe headache, stiff or painful neck, chest pain, shortness of breath, or trouble swallowing.  · You develop severe throat pain, drooling, or changes in your voice.  · You develop swelling of the neck, or the skin on the neck becomes red and tender.  · You have a fever.  · You develop signs of dehydration, such as fatigue, dry mouth, and decreased urination.  · You become increasingly sleepy, or you cannot wake up completely.  Document Released: 02/03/2000 Document Revised: 01/23/2012 Document Reviewed: 04/06/2010  ExitCare® Patient Information ©2015 [employer]. This information is not intended   HOME CARE INSTRUCTIONS   Mix 1 teaspoon of salt in 8 ounces of warm water.  Gargle with this solution as much or often as you need or as directed. Swish and gargle gently if you have any sores or wounds in your mouth.  Do not swallow this mixture. Document Released: 11/10/2003 Document Revised: 04/30/2011 Document Reviewed: 04/02/2008 [facility]ExitCare Patient Information 2015 [place], [place]. This information is not  intended to replace advice given to you by your health care provider. Make sure you discuss any questions you have with your health care provider. Back Pain, Adult Low back pain is very common. About 1 in 5 people have back pain.The cause of low back pain is rarely dangerous. The pain often gets better over time.About half of people with a sudden onset of back pain feel better in just 2 weeks. About 8 in 10 people feel better by 6 weeks.  CAUSES Some common causes of back pain include:  Strain of the muscles or ligaments supporting the spine.  Wear and tear (degeneration) of the spinal discs.  Arthritis.  Direct injury to the back. DIAGNOSIS Most of the time, the direct cause of low back pain is not known.However, back pain can be treated effectively even when the exact cause of the pain is unknown.Answering your caregiver's questions about your overall health and symptoms is one of the most accurate ways to make sure the cause of your pain is not dangerous. If your caregiver needs more information, he or she may order lab work or imaging tests (X-rays or MRIs).However, even if imaging tests show changes in your back, this usually does not require surgery. HOME CARE INSTRUCTIONS For many people, back pain returns.Since low back pain is rarely dangerous, it is often a condition that people can learn to [facility] Pmanageon their own.   Remain active. It is stressful on the back to sit or stand in one place. Do not sit, drive, or stand in one place for more than 30 minutes at a time. Take short walks on level surfaces as soon as pain allows.Try to increase the length of time you walk each day.  Do not stay in bed.Resting more than 1 or 2 days can delay your recovery.  Do not avoid exercise or work.Your body is made to move.It is not dangerous to be active, even though your back may hurt.Your back will likely heal faster if you return to being active before your pain is gone.  Pay attention to  your body when you bend and lift. Many people have less discomfortwhen lifting if they bend their knees, keep the load close to their bodies,and avoid twisting. Often, the most comfortable positions are those that put less stress on your recovering back.  Find a comfortable position to sleep. Use a firm mattress and lie on your side with your knees slightly bent. If you lie on your back, put a pillow under your knees.  Only take over-the-counter or prescription medicines as directed by your caregiver. Over-the-counter medicines to reduce pain and inflammation are often the most helpful.Your caregiver may prescribe muscle relaxant drugs.These medicines help dull your pain so you can more quickly return to your normal activities and healthy exercise.  Put ice on the injured area.  Put ice in a plastic bag.  Place a towel between your skin and the bag.  Leave the ice on for 15-20 minutes, 03-04 times a day for the first 2 to 3 days. After that, ice and heat may be alternated  to reduce pain and spasms.  Ask your caregiver about trying back exercises and gentle massage. This may be of some benefit.  Avoid feeling anxious or stressed.Stress increases muscle tension and can worsen back pain.It is important to recognize when you are anxious or stressed and learn ways to manage it.Exercise is a great option. SEEK MEDICAL CARE IF:  You have pain that is not relieved with rest or medicine.  You have pain that does not improve in 1 week.  You have new symptoms.  You are generally not feeling well. SEEK IMMEDIATE MEDICAL CARE IF:   You have pain that radiates from your back into your legs.  You develop new bowel or bladder control problems.  You have unusual weakness or numbness in your arms or legs.  You develop nausea or vomiting.  You develop abdominal pain.  You feel faint. Document Released: 02/05/2005 Document Revised: 08/07/2011 Document Reviewed: 06/26/2010 [facility]ExitCare  Patient Information 2015 [place], [place]LL[place]. This information is not intended to replace advice given to you by your health care provider. Make sure you discuss any questions you have with your health care provider.
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
## 2013-08-11 NOTE — ED Notes (Signed)
Pt states she has a sore throat, headache and her neck is sore to touch. Symptoms since last night. Pt alert, no acute distress. Able to speak clearly.

## 2013-08-12 NOTE — ED Provider Notes (Signed)
Medical screening examination/treatment/procedure(s) were performed by non-physician practitioner and as supervising physician I was immediately available for consultation/collaboration.   EKG Interpretation None        Junius ArgyleForrest S Harrison, MD 08/12/13 [phone]

## 2013-08-13 LAB — CULTURE, GROUP A STREP

## 2013-10-20 ENCOUNTER — Encounter (HOSPITAL_COMMUNITY): Payer: Self-pay | Admitting: Emergency Medicine

## 2013-10-20 ENCOUNTER — Emergency Department (HOSPITAL_COMMUNITY)
Admission: EM | Admit: 2013-10-20 | Discharge: 2013-10-20 | Disposition: A | Discharge status: Home / Self Care | Payer: BC Managed Care – PPO | Attending: Emergency Medicine | Admitting: Emergency Medicine

## 2013-10-20 DIAGNOSIS — G43909 Migraine, unspecified, not intractable, without status migrainosus: Secondary | ICD-10-CM | POA: Insufficient documentation

## 2013-10-20 DIAGNOSIS — G43109 Migraine with aura, not intractable, without status migrainosus: Secondary | ICD-10-CM | POA: Insufficient documentation

## 2013-10-20 DIAGNOSIS — Z87448 Personal history of other diseases of urinary system: Secondary | ICD-10-CM | POA: Insufficient documentation

## 2013-10-20 DIAGNOSIS — Z87828 Personal history of other (healed) physical injury and trauma: Secondary | ICD-10-CM | POA: Insufficient documentation

## 2013-10-20 DIAGNOSIS — G43111 Migraine with aura, intractable, with status migrainosus: Secondary | ICD-10-CM

## 2013-10-20 DIAGNOSIS — R110 Nausea: Secondary | ICD-10-CM | POA: Insufficient documentation

## 2013-10-20 DIAGNOSIS — Z3202 Encounter for pregnancy test, result negative: Secondary | ICD-10-CM | POA: Insufficient documentation

## 2013-10-20 LAB — POC URINE PREG, ED: Preg Test, Ur: NEGATIVE

## 2013-10-20 MED ORDER — OXYCODONE-ACETAMINOPHEN 5-325 MG PO TABS
1.0000 | ORAL_TABLET | Freq: Once | ORAL | Status: AC
  Administered 2013-10-20: 1 via ORAL
  Filled 2013-10-20: qty 1

## 2013-10-20 MED ORDER — DIPHENHYDRAMINE HCL 50 MG/ML IJ SOLN
25.0000 mg | Freq: Once | INTRAMUSCULAR | Status: AC
  Administered 2013-10-20: 25 mg via INTRAVENOUS
  Filled 2013-10-20: qty 1

## 2013-10-20 MED ORDER — METOCLOPRAMIDE HCL 5 MG/ML IJ SOLN
10.0000 mg | Freq: Once | INTRAMUSCULAR | Status: AC
  Administered 2013-10-20: 10 mg via INTRAVENOUS
  Filled 2013-10-20: qty 2

## 2013-10-20 MED ORDER — DEXAMETHASONE SODIUM PHOSPHATE 10 MG/ML IJ SOLN
10.0000 mg | Freq: Once | INTRAMUSCULAR | Status: AC
  Administered 2013-10-20: 10 mg via INTRAVENOUS
  Filled 2013-10-20: qty 1

## 2013-10-20 MED ORDER — SODIUM CHLORIDE 0.9 % IV SOLN
1000.0000 mL | Freq: Once | INTRAVENOUS | Status: AC
  Administered 2013-10-20: 1000 mL via INTRAVENOUS

## 2013-10-20 MED ORDER — SODIUM CHLORIDE 0.9 % IV SOLN
1000.0000 mL | INTRAVENOUS | Status: DC
  Administered 2013-10-20: 1000 mL via INTRAVENOUS

## 2013-10-20 NOTE — ED Notes (Signed)
Pt in c/o migraine since around noon today, states before headache started she had an acute episode of vision changes where she reports blurred vision, states that resolved after 15-20 min, then developed a left sided headache and right arm numbness and tingling- the arm symptoms also resolved- headache is generalized now with pain in her neck, c/o nausea. Pt has long history of migraines with nausea but states these other symptoms are new. Pt rates pain 8/10 at this time.
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
## 2013-10-20 NOTE — Discharge Instructions (Signed)

## 2013-10-20 NOTE — ED Notes (Signed)
Pt reports bilateral HA since 1230 today that is progressively worsening. Hx of migraines, states this feels the same. Denies taking anything for pain. Neuro intact. AO x4.

## 2013-10-21 NOTE — ED Provider Notes (Signed)
This patient was seen in conjunction with the resident physician.  The documentation accurately reflects the patient's ED encounter.  On my exam, patient was awake and alert, with minimal symptoms. Though the patient has a new features of her headache, there is low suspicion for new intracranial processes, such as subarachnoid hemorrhage or infection given the resolution of her pain.     Gerhard Munch, MD 10/21/13 0020

## 2013-12-21 ENCOUNTER — Encounter (HOSPITAL_COMMUNITY): Payer: Self-pay | Admitting: Emergency Medicine

## 2014-03-04 ENCOUNTER — Encounter (HOSPITAL_COMMUNITY): Payer: Self-pay | Admitting: Obstetrics and Gynecology

## 2014-07-23 IMAGING — CR DG CERVICAL SPINE COMPLETE 4+V
5 series · 5 of 5 positions shown · non-contrast
Comparison: None.

CLINICAL DATA: Posterior and right-sided pain.  No acute
trauma.Torticollis.

CERVICAL SPINE - COMPLETE 4+ VIEW

[view not recorded (1 of 5)]
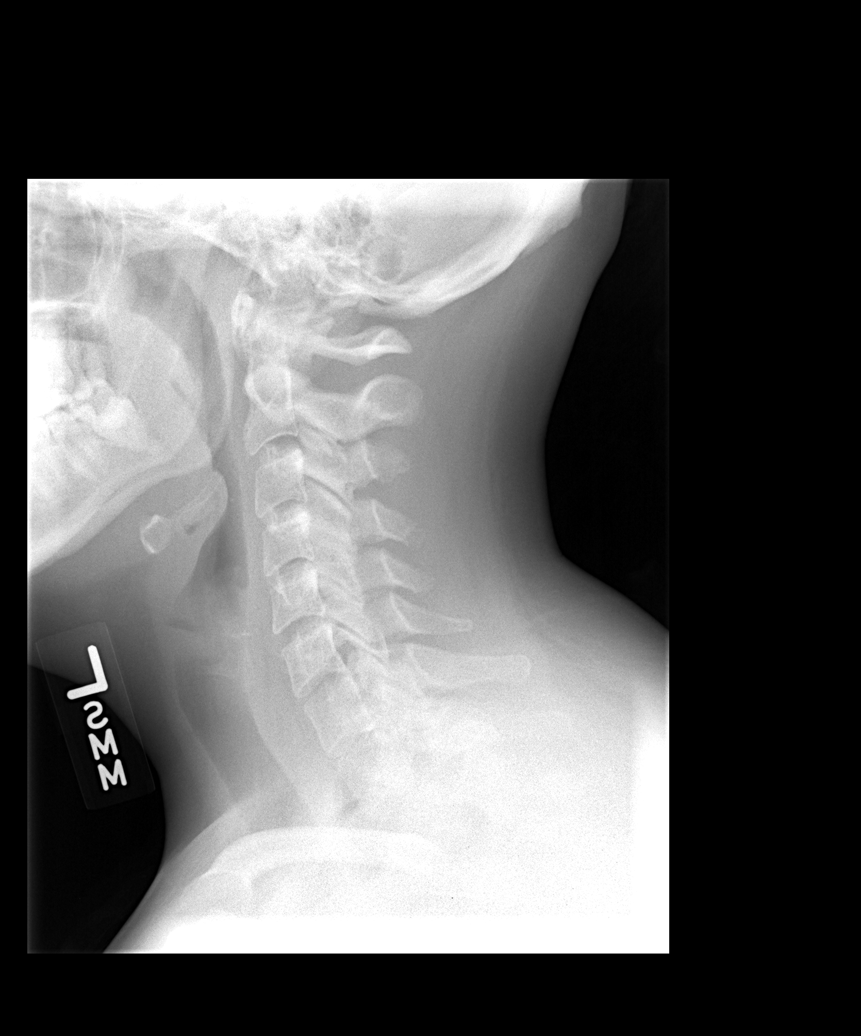

[view not recorded (2 of 5)]
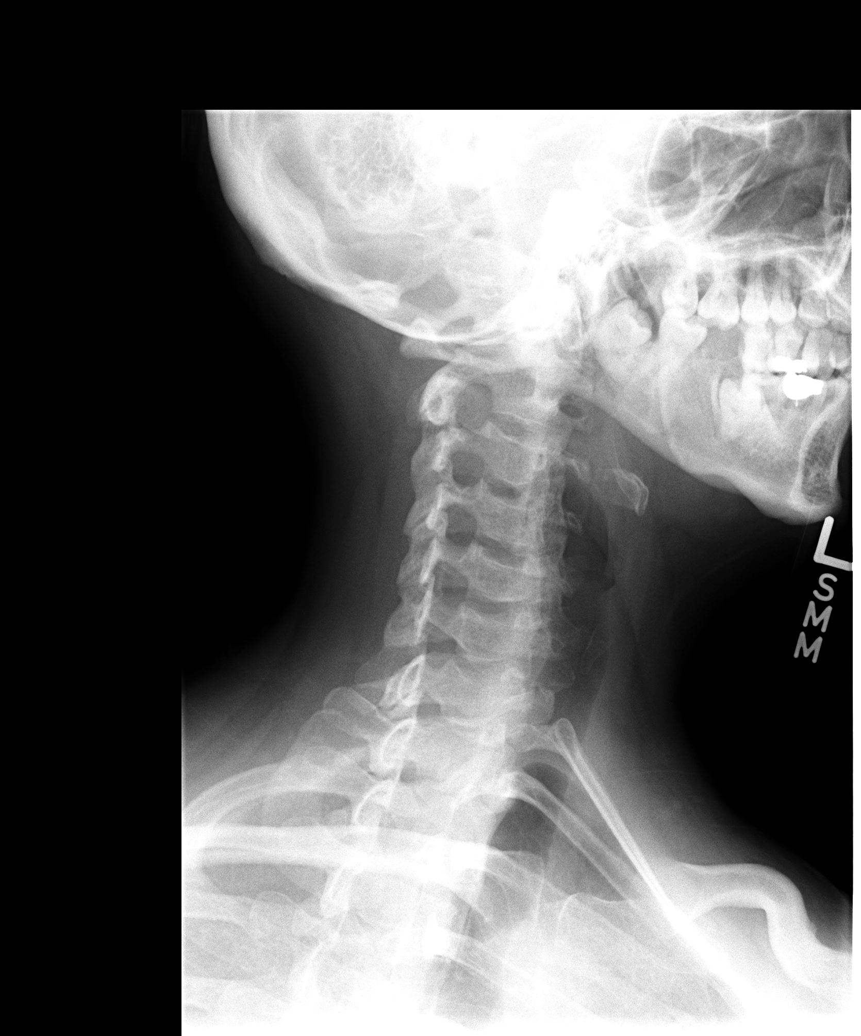

[view not recorded (3 of 5)]
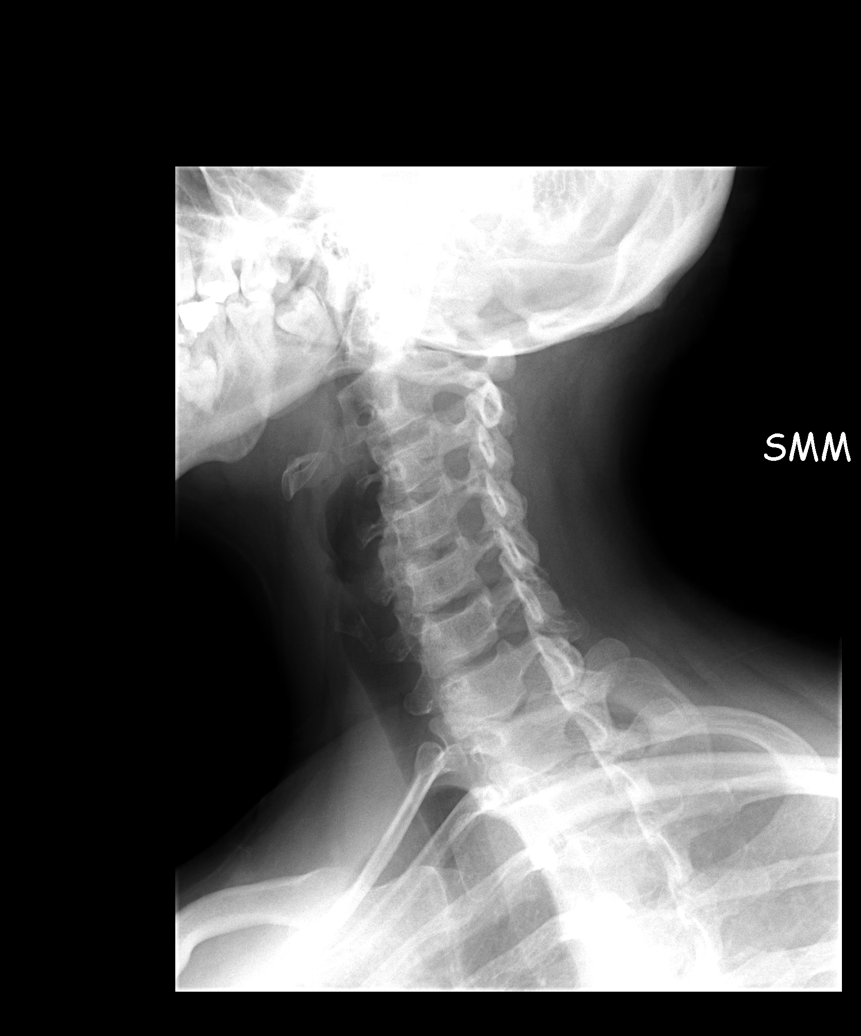

[view not recorded (4 of 5)]
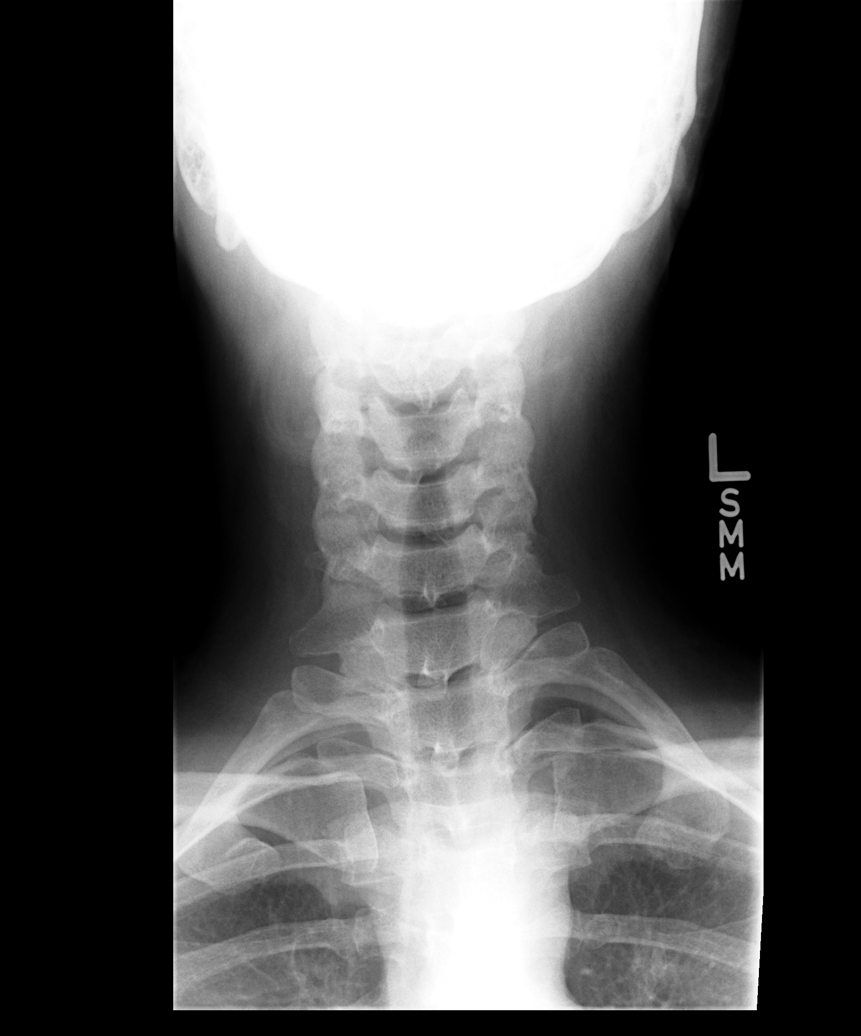

[view not recorded (5 of 5)]
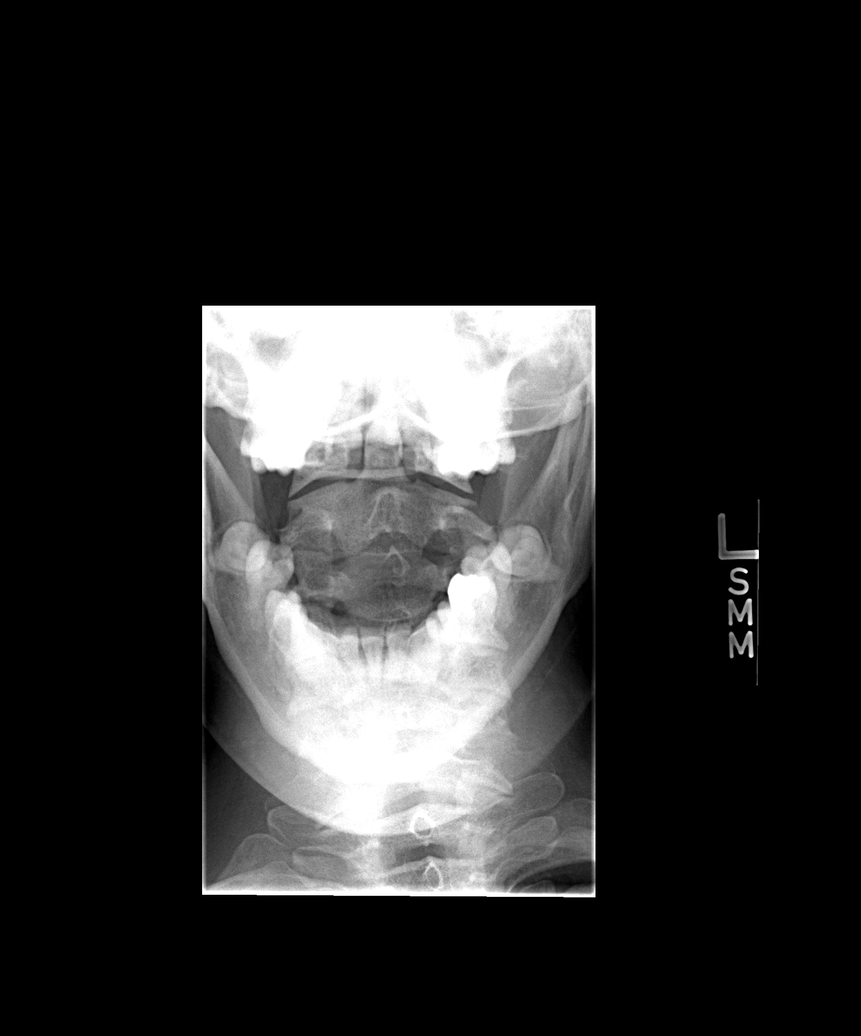

[5 of 5 positions shown; findings below may reference images not displayed]

FINDINGS: Alignment is normal.  No soft tissue swelling.  No
degenerative changes or focal lesions.
IMPRESSION: Normal radiographs.

## 2014-07-29 ENCOUNTER — Encounter (HOSPITAL_COMMUNITY): Payer: Self-pay | Admitting: Obstetrics and Gynecology

## 2015-11-13 ENCOUNTER — Emergency Department (HOSPITAL_COMMUNITY)
Admission: EM | Admit: 2015-11-13 | Discharge: 2015-11-14 | Disposition: A | Discharge status: Home / Self Care | Payer: Medicaid Other | Attending: Emergency Medicine | Admitting: Emergency Medicine

## 2015-11-13 ENCOUNTER — Encounter (HOSPITAL_COMMUNITY): Payer: Self-pay | Admitting: *Deleted

## 2015-11-13 DIAGNOSIS — N159 Renal tubulo-interstitial disease, unspecified: Secondary | ICD-10-CM | POA: Insufficient documentation

## 2015-11-13 DIAGNOSIS — Z5181 Encounter for therapeutic drug level monitoring: Secondary | ICD-10-CM | POA: Insufficient documentation

## 2015-11-13 DIAGNOSIS — R002 Palpitations: Secondary | ICD-10-CM | POA: Insufficient documentation

## 2015-11-13 LAB — URINALYSIS, ROUTINE W REFLEX MICROSCOPIC
Bilirubin Urine: NEGATIVE
GLUCOSE, UA: NEGATIVE mg/dL
Ketones, ur: NEGATIVE mg/dL
Nitrite: NEGATIVE
Protein, ur: NEGATIVE mg/dL
Specific Gravity, Urine: 1.015 (ref 1.005–1.030)
pH: 8 (ref 5.0–8.0)

## 2015-11-13 LAB — CBC WITH DIFFERENTIAL/PLATELET
BASOS ABS: 0.1 10*3/uL (ref 0.0–0.1)
Basophils Relative: 1 %
EOS PCT: 2 %
Eosinophils Absolute: 0.2 10*3/uL (ref 0.0–0.7)
HCT: 36.9 % (ref 36.0–46.0)
Hemoglobin: 12.5 g/dL (ref 12.0–15.0)
Lymphocytes Relative: 40 %
Lymphs Abs: 3.6 10*3/uL (ref 0.7–4.0)
MCH: 31.3 pg (ref 26.0–34.0)
MCHC: 33.9 g/dL (ref 30.0–36.0)
MCV: 92.3 fL (ref 78.0–100.0)
Monocytes Absolute: 0.5 10*3/uL (ref 0.1–1.0)
Monocytes Relative: 6 %
NEUTROS ABS: 4.5 10*3/uL (ref 1.7–7.7)
Neutrophils Relative %: 51 %
PLATELETS: 261 10*3/uL (ref 150–400)
RBC: 4 MIL/uL (ref 3.87–5.11)
RDW: 11.9 % (ref 11.5–15.5)
WBC: 8.8 10*3/uL (ref 4.0–10.5)

## 2015-11-13 LAB — I-STAT TROPONIN, ED: Troponin i, poc: 0 ng/mL (ref 0.00–0.08)

## 2015-11-13 LAB — COMPREHENSIVE METABOLIC PANEL
ALK PHOS: 44 U/L (ref 38–126)
ALT: 15 U/L (ref 14–54)
AST: 20 U/L (ref 15–41)
Albumin: 3.9 g/dL (ref 3.5–5.0)
Anion gap: 10 (ref 5–15)
BUN: 15 mg/dL (ref 6–20)
CALCIUM: 9.2 mg/dL (ref 8.9–10.3)
CHLORIDE: 106 mmol/L (ref 101–111)
CO2: 22 mmol/L (ref 22–32)
CREATININE: 0.92 mg/dL (ref 0.44–1.00)
Glucose, Bld: 81 mg/dL (ref 65–99)
Potassium: 3.6 mmol/L (ref 3.5–5.1)
SODIUM: 138 mmol/L (ref 135–145)
Total Bilirubin: 0.3 mg/dL (ref 0.3–1.2)
Total Protein: 6.6 g/dL (ref 6.5–8.1)

## 2015-11-13 LAB — URINE MICROSCOPIC-ADD ON

## 2015-11-13 LAB — RAPID URINE DRUG SCREEN, HOSP PERFORMED
AMPHETAMINES: NOT DETECTED
BARBITURATES: NOT DETECTED
BENZODIAZEPINES: NOT DETECTED
Cocaine: NOT DETECTED
Opiates: NOT DETECTED
Tetrahydrocannabinol: NOT DETECTED

## 2015-11-13 LAB — POC URINE PREG, ED: Preg Test, Ur: NEGATIVE

## 2015-11-13 MED ORDER — ONDANSETRON HCL 4 MG/2ML IJ SOLN
4.0000 mg | Freq: Once | INTRAMUSCULAR | Status: AC
  Administered 2015-11-14: 4 mg via INTRAVENOUS
  Filled 2015-11-13: qty 2

## 2015-11-13 MED ORDER — SODIUM CHLORIDE 0.9 % IV BOLUS (SEPSIS)
1000.0000 mL | Freq: Once | INTRAVENOUS | Status: AC
  Administered 2015-11-13: 1000 mL via INTRAVENOUS

## 2015-11-13 NOTE — ED Triage Notes (Signed)
Patient states she went to dinner about 7pm and felt like her heart was racing.  Stated she ate and got her children settled at home and her heart continued to race  Stated this happened about 6 months ago but subsided in about 1 hour.  Stated it has been a stressful week - childrens grandmother passed yesterday
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
## 2015-11-14 LAB — TSH: TSH: 4.421 u[IU]/mL (ref 0.350–4.500)

## 2015-11-14 LAB — T4, FREE: Free T4: 1.09 ng/dL (ref 0.61–1.12)

## 2015-11-14 NOTE — Discharge Instructions (Signed)
1. Medications: usual home medications 2. Treatment: rest, drink plenty of fluids, decrease caffeine intake, decrease stress 3. Follow Up: Please followup with your primary doctor in 2 days and cardiology ASAP for discussion of your diagnoses and further evaluation after today's visit; if you do not have a primary care doctor use the resource guide provided to find one; Please return to the ER for return or worsening of symptoms, syncope or other concerns

## 2015-11-15 LAB — URINE CULTURE

## 2016-05-16 ENCOUNTER — Emergency Department (HOSPITAL_COMMUNITY)
Admission: EM | Admit: 2016-05-16 | Discharge: 2016-05-16 | Disposition: A | Discharge status: Home / Self Care | Payer: BLUE CROSS/BLUE SHIELD | Source: Home / Self Care

## 2016-05-16 ENCOUNTER — Encounter (HOSPITAL_COMMUNITY): Payer: Self-pay | Admitting: Emergency Medicine

## 2016-05-16 ENCOUNTER — Encounter (HOSPITAL_COMMUNITY): Payer: Self-pay

## 2016-05-16 ENCOUNTER — Emergency Department (HOSPITAL_COMMUNITY)
Admission: EM | Admit: 2016-05-16 | Discharge: 2016-05-16 | Disposition: A | Discharge status: Home / Self Care | Payer: BLUE CROSS/BLUE SHIELD | Attending: Emergency Medicine | Admitting: Emergency Medicine

## 2016-05-16 DIAGNOSIS — J029 Acute pharyngitis, unspecified: Secondary | ICD-10-CM | POA: Insufficient documentation

## 2016-05-16 DIAGNOSIS — J039 Acute tonsillitis, unspecified: Secondary | ICD-10-CM | POA: Diagnosis not present

## 2016-05-16 DIAGNOSIS — Z5321 Procedure and treatment not carried out due to patient leaving prior to being seen by health care provider: Secondary | ICD-10-CM | POA: Insufficient documentation

## 2016-05-16 LAB — RAPID STREP SCREEN (MED CTR MEBANE ONLY): STREPTOCOCCUS, GROUP A SCREEN (DIRECT): NEGATIVE

## 2016-05-16 MED ORDER — IBUPROFEN 400 MG PO TABS
400.0000 mg | ORAL_TABLET | Freq: Once | ORAL | Status: AC | PRN
  Administered 2016-05-16: 400 mg via ORAL

## 2016-05-16 MED ORDER — IBUPROFEN 400 MG PO TABS
ORAL_TABLET | ORAL | Status: AC
  Administered 2016-05-16: 400 mg via ORAL
  Filled 2016-05-16: qty 1

## 2016-05-16 MED ORDER — NAPROXEN 500 MG PO TABS
500.0000 mg | ORAL_TABLET | Freq: Two times a day (BID) | ORAL | 0 refills | Status: DC
Start: 2016-05-16 — End: 2018-03-17

## 2016-05-16 MED ORDER — DEXAMETHASONE SODIUM PHOSPHATE 10 MG/ML IJ SOLN
10.0000 mg | Freq: Once | INTRAMUSCULAR | Status: AC
  Administered 2016-05-16: 10 mg via INTRAMUSCULAR
  Filled 2016-05-16: qty 1

## 2016-05-16 MED ORDER — NAPROXEN 250 MG PO TABS
500.0000 mg | ORAL_TABLET | Freq: Once | ORAL | Status: DC
  Filled 2016-05-16: qty 2

## 2016-05-16 MED ORDER — IBUPROFEN 800 MG PO TABS
ORAL_TABLET | ORAL | Status: AC
  Filled 2016-05-16: qty 1

## 2016-05-16 MED ORDER — PENICILLIN G BENZATHINE 1200000 UNIT/2ML IM SUSP
1.2000 10*6.[IU] | Freq: Once | INTRAMUSCULAR | Status: AC
  Administered 2016-05-16: 1.2 10*6.[IU] via INTRAMUSCULAR
  Filled 2016-05-16: qty 2

## 2016-05-16 MED ORDER — IBUPROFEN 800 MG PO TABS
800.0000 mg | ORAL_TABLET | Freq: Once | ORAL | Status: AC
  Administered 2016-05-16: 800 mg via ORAL

## 2016-05-16 NOTE — ED Notes (Signed)
Pt verbalized understanding of discharge instructions. Pt ambulatory to waiting room.  

## 2016-05-16 NOTE — ED Triage Notes (Signed)
Pt c/o sore throat and right ear pain since yesterday.
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
## 2016-05-16 NOTE — ED Triage Notes (Signed)
Pt endorses sore throat and right ear pain since this morning. Denies chills or fever. VSS.

## 2016-05-16 NOTE — ED Notes (Signed)
Pt stated she was leaving due to the wait time.

## 2016-05-18 LAB — CULTURE, GROUP A STREP (THRC)

## 2018-03-17 ENCOUNTER — Ambulatory Visit: Payer: BLUE CROSS/BLUE SHIELD | Admitting: Obstetrics & Gynecology

## 2018-03-17 ENCOUNTER — Encounter: Payer: Self-pay | Admitting: Obstetrics & Gynecology

## 2018-03-17 VITALS — BP 110/67 | HR 69 | Ht 64.0 in | Wt 164.0 lb

## 2018-03-17 DIAGNOSIS — Z3202 Encounter for pregnancy test, result negative: Secondary | ICD-10-CM

## 2018-03-17 DIAGNOSIS — N898 Other specified noninflammatory disorders of vagina: Secondary | ICD-10-CM

## 2018-03-17 DIAGNOSIS — N9489 Other specified conditions associated with female genital organs and menstrual cycle: Secondary | ICD-10-CM | POA: Diagnosis not present

## 2018-03-17 LAB — POCT URINE PREGNANCY: Preg Test, Ur: NEGATIVE

## 2018-03-17 NOTE — Progress Notes (Signed)
Cryotherapy Procedure Note  Pre-operative Diagnosis: pathological volume of vaginal discharge unresponsive to pharmacological management  Post-operative Diagnosis: same  Locations:  vaginal  Indications:   Anesthesia: not required   Procedure Details  History of allergy to iodine: .n/a Pacemaker? .n/a  Patient informed of risks (permanent scarring, infection, light or dark discoloration, bleeding, infection, weakness, numbness and recurrence of the lesion) and benefits of the procedure and written informed consent obtained.  Large cervical tip is applied for 3 minutes after ice ball begins to form  Thawing for 5-10 minutes then reapplication for 3 minutes once again  Condition: Stable  Complications: none.  Plan: 1. Instructed to keep the area dry and covered for 24-48h and clean thereafter. 2. Warning signs of infection were reviewed.   3. Recommended that the patient use OTC ibuprofen as needed for pain.  4. Return in 4 weeks.

## 2018-04-17 ENCOUNTER — Encounter: Payer: Self-pay | Admitting: Obstetrics & Gynecology

## 2018-04-17 ENCOUNTER — Ambulatory Visit: Payer: BLUE CROSS/BLUE SHIELD | Admitting: Obstetrics & Gynecology

## 2018-04-17 VITALS — BP 126/70 | HR 69 | Ht 64.0 in

## 2018-04-17 DIAGNOSIS — N898 Other specified noninflammatory disorders of vagina: Secondary | ICD-10-CM | POA: Diagnosis not present

## 2018-04-17 DIAGNOSIS — Z9889 Other specified postprocedural states: Secondary | ICD-10-CM

## 2018-04-17 MED ORDER — SILVER SULFADIAZINE 1 % EX CREA: TOPICAL_CREAM | CUTANEOUS | 11 refills | Status: DC

## 2018-04-17 NOTE — Progress Notes (Signed)
  HPI: Patient returns for routine postoperative follow-up having undergone cryotherapy of the cervix on 03/17/2018.  The patient's immediate postoperative recovery has been unremarkable. Since hospital discharge the patient reports no problems.   Current Outpatient Medications: celecoxib (CELEBREX) 100 MG capsule, Take 100 mg by mouth 2 (two) times daily., Disp: , Rfl:  silver sulfADIAZINE (SILVADENE) 1 % cream, Apply 2-3 times daily, Disp: 50 g, Rfl: 11  No current facility-administered medications for this visit.     Blood pressure 126/70, pulse 69, height 5\' 4"  (1.626 m).  Physical Exam: Cervix is 80% healed a little tender Pad changes on bottom chronic  Diagnostic Tests:   Pathology:   Impression: S/p cryo for leukorrhea  Plan: Concerned will not work Continue probiotics Meds ordered this encounter  Medications  . silver sulfADIAZINE (SILVADENE) 1 % cream    Sig: Apply 2-3 times daily    Dispense:  50 g    Refill:  11     Follow up: prn    Lazaro Arms, MD

## 2018-05-01 ENCOUNTER — Other Ambulatory Visit: Payer: Self-pay | Admitting: Family Medicine

## 2018-05-01 DIAGNOSIS — R600 Localized edema: Secondary | ICD-10-CM

## 2018-05-08 ENCOUNTER — Encounter: Payer: Self-pay | Admitting: Radiology

## 2018-05-08 ENCOUNTER — Ambulatory Visit
Admission: RE | Admit: 2018-05-08 | Discharge: 2018-05-08 | Disposition: A | Discharge status: Home / Self Care | Payer: BLUE CROSS/BLUE SHIELD | Source: Ambulatory Visit | Attending: Family Medicine | Admitting: Family Medicine

## 2018-05-08 DIAGNOSIS — R600 Localized edema: Secondary | ICD-10-CM

## 2018-05-08 MED ORDER — IOPAMIDOL (ISOVUE-370) INJECTION 76%
100.0000 mL | Freq: Once | INTRAVENOUS | Status: AC | PRN
  Administered 2018-05-08: 100 mL via INTRAVENOUS

## 2020-03-24 ENCOUNTER — Other Ambulatory Visit: Payer: Self-pay | Admitting: Nurse Practitioner

## 2020-03-24 ENCOUNTER — Encounter: Payer: Self-pay | Admitting: Nurse Practitioner

## 2020-03-24 ENCOUNTER — Other Ambulatory Visit: Payer: Self-pay

## 2020-03-24 ENCOUNTER — Other Ambulatory Visit (HOSPITAL_COMMUNITY)
Admission: RE | Admit: 2020-03-24 | Discharge: 2020-03-24 | Disposition: A | Discharge status: Home / Self Care | Payer: BLUE CROSS/BLUE SHIELD | Source: Ambulatory Visit | Attending: Nurse Practitioner | Admitting: Nurse Practitioner

## 2020-03-24 ENCOUNTER — Ambulatory Visit: Payer: 59 | Admitting: Nurse Practitioner

## 2020-03-24 VITALS — Ht 63.0 in | Wt 180.0 lb

## 2020-03-24 DIAGNOSIS — Z01419 Encounter for gynecological examination (general) (routine) without abnormal findings: Secondary | ICD-10-CM | POA: Diagnosis not present

## 2020-03-24 DIAGNOSIS — N898 Other specified noninflammatory disorders of vagina: Secondary | ICD-10-CM | POA: Diagnosis not present

## 2020-03-24 DIAGNOSIS — Z113 Encounter for screening for infections with a predominantly sexual mode of transmission: Secondary | ICD-10-CM | POA: Insufficient documentation

## 2020-03-24 DIAGNOSIS — Z3046 Encounter for surveillance of implantable subdermal contraceptive: Secondary | ICD-10-CM

## 2020-03-24 DIAGNOSIS — N761 Subacute and chronic vaginitis: Secondary | ICD-10-CM

## 2020-03-24 LAB — WET PREP FOR TRICH, YEAST, CLUE

## 2020-03-24 MED ORDER — HYDROCORTISONE ACETATE 25 MG RE SUPP: RECTAL | 1 refills | Status: DC

## 2020-03-24 NOTE — Patient Instructions (Signed)
Health Maintenance, Female Adopting a healthy lifestyle and getting preventive care are important in promoting health and wellness. Ask your health care provider about:  The right schedule for you to have regular tests and exams.  Things you can do on your own to prevent diseases and keep yourself healthy. What should I know about diet, weight, and exercise? Eat a healthy diet  Eat a diet that includes plenty of vegetables, fruits, low-fat dairy products, and lean protein.  Do not eat a lot of foods that are high in solid fats, added sugars, or sodium.   Maintain a healthy weight Body mass index (BMI) is used to identify weight problems. It estimates body fat based on height and weight. Your health care provider can help determine your BMI and help you achieve or maintain a healthy weight. Get regular exercise Get regular exercise. This is one of the most important things you can do for your health. Most adults should:  Exercise for at least 150 minutes each week. The exercise should increase your heart rate and make you sweat (moderate-intensity exercise).  Do strengthening exercises at least twice a week. This is in addition to the moderate-intensity exercise.  Spend less time sitting. Even light physical activity can be beneficial. Watch cholesterol and blood lipids Have your blood tested for lipids and cholesterol at 38 years of age, then have this test every 5 years. Have your cholesterol levels checked more often if:  Your lipid or cholesterol levels are high.  You are older than 38 years of age.  You are at high risk for heart disease. What should I know about cancer screening? Depending on your health history and family history, you may need to have cancer screening at various ages. This may include screening for:  Breast cancer.  Cervical cancer.  Colorectal cancer.  Skin cancer.  Lung cancer. What should I know about heart disease, diabetes, and high blood  pressure? Blood pressure and heart disease  High blood pressure causes heart disease and increases the risk of stroke. This is more likely to develop in people who have high blood pressure readings, are of African descent, or are overweight.  Have your blood pressure checked: ? Every 3-5 years if you are 18-39 years of age. ? Every year if you are 40 years old or older. Diabetes Have regular diabetes screenings. This checks your fasting blood sugar level. Have the screening done:  Once every three years after age 40 if you are at a normal weight and have a low risk for diabetes.  More often and at a younger age if you are overweight or have a high risk for diabetes. What should I know about preventing infection? Hepatitis B If you have a higher risk for hepatitis B, you should be screened for this virus. Talk with your health care provider to find out if you are at risk for hepatitis B infection. Hepatitis C Testing is recommended for:  Everyone born from 1945 through 1965.  Anyone with known risk factors for hepatitis C. Sexually transmitted infections (STIs)  Get screened for STIs, including gonorrhea and chlamydia, if: ? You are sexually active and are younger than 38 years of age. ? You are older than 38 years of age and your health care provider tells you that you are at risk for this type of infection. ? Your sexual activity has changed since you were last screened, and you are at increased risk for chlamydia or gonorrhea. Ask your health care provider   if you are at risk.  Ask your health care provider about whether you are at high risk for HIV. Your health care provider may recommend a prescription medicine to help prevent HIV infection. If you choose to take medicine to prevent HIV, you should first get tested for HIV. You should then be tested every 3 months for as long as you are taking the medicine. Pregnancy  If you are about to stop having your period (premenopausal) and  you may become pregnant, seek counseling before you get pregnant.  Take 400 to 800 micrograms (mcg) of folic acid every day if you become pregnant.  Ask for birth control (contraception) if you want to prevent pregnancy. Osteoporosis and menopause Osteoporosis is a disease in which the bones lose minerals and strength with aging. This can result in bone fractures. If you are 65 years old or older, or if you are at risk for osteoporosis and fractures, ask your health care provider if you should:  Be screened for bone loss.  Take a calcium or vitamin D supplement to lower your risk of fractures.  Be given hormone replacement therapy (HRT) to treat symptoms of menopause. Follow these instructions at home: Lifestyle  Do not use any products that contain nicotine or tobacco, such as cigarettes, e-cigarettes, and chewing tobacco. If you need help quitting, ask your health care provider.  Do not use street drugs.  Do not share needles.  Ask your health care provider for help if you need support or information about quitting drugs. Alcohol use  Do not drink alcohol if: ? Your health care provider tells you not to drink. ? You are pregnant, may be pregnant, or are planning to become pregnant.  If you drink alcohol: ? Limit how much you use to 0-1 drink a day. ? Limit intake if you are breastfeeding.  Be aware of how much alcohol is in your drink. In the [place] one drink equals one 12 oz bottle of beer (355 mL), one 5 oz glass of wine (148 mL), or one 1 oz glass of hard liquor (44 mL). General instructions  Schedule regular health, dental, and eye exams.  Stay current with your vaccines.  Tell your health care provider if: ? You often feel depressed. ? You have ever been abused or do not feel safe at home. Summary  Adopting a healthy lifestyle and getting preventive care are important in promoting health and wellness.  Follow your health care provider's instructions about healthy  diet, exercising, and getting tested or screened for diseases.  Follow your health care provider's instructions on monitoring your cholesterol and blood pressure. This information is not intended to replace advice given to you by your health care provider. Make sure you discuss any questions you have with your health care provider. Document Revised: 01/29/2018 Document Reviewed: 01/29/2018 Elsevier Patient Education  2021 [employer]  
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
## 2020-03-25 ENCOUNTER — Telehealth: Payer: Self-pay | Admitting: *Deleted

## 2020-03-25 NOTE — Telephone Encounter (Signed)
Patient was prescribed Anusol-HC 25 mg suppository, medication is not covered by her insurance. Patient will need another Rx. Please advise

## 2020-03-27 NOTE — Telephone Encounter (Signed)
Called [facility] for compounded Vaginal Hydrocortisone 10%, insert 3 grams per vagina daily x 30 days, disp: 60 gm

## 2020-03-28 ENCOUNTER — Other Ambulatory Visit: Payer: Self-pay | Admitting: Nurse Practitioner

## 2020-03-28 MED ORDER — UNABLE TO FIND: 3.0000 g | Freq: Every day | 1 refills | Status: DC

## 2020-03-29 LAB — CYTOLOGY - PAP
Chlamydia: NEGATIVE
Comment: NEGATIVE
Comment: NEGATIVE
Comment: NORMAL
High risk HPV: NEGATIVE
Neisseria Gonorrhea: NEGATIVE

## 2020-08-09 ENCOUNTER — Other Ambulatory Visit: Payer: Self-pay | Admitting: Nurse Practitioner

## 2020-08-09 MED ORDER — MEDROXYPROGESTERONE ACETATE 10 MG PO TABS: 10.0000 mg | ORAL_TABLET | Freq: Every day | ORAL | 1 refills | Status: DC

## 2020-08-09 NOTE — Progress Notes (Signed)
Irregular bleeding on Nexplanon. Called to say she is having a second period this month and is heavy. Called provera 10 mg to pharmacy x 10 days. Follow up if no better [place], [facility]

## 2020-08-10 ENCOUNTER — Ambulatory Visit
Admission: EM | Admit: 2020-08-10 | Discharge: 2020-08-10 | Disposition: A | Discharge status: Home / Self Care | Payer: 59 | Attending: Family Medicine | Admitting: Family Medicine

## 2020-08-10 ENCOUNTER — Other Ambulatory Visit: Payer: Self-pay

## 2020-08-10 DIAGNOSIS — J0140 Acute pansinusitis, unspecified: Secondary | ICD-10-CM

## 2020-08-10 MED ORDER — AMOXICILLIN-POT CLAVULANATE 875-125 MG PO TABS: 1.0000 | ORAL_TABLET | Freq: Two times a day (BID) | ORAL | 0 refills | Status: AC

## 2020-08-10 MED ORDER — FLUCONAZOLE 150 MG PO TABS: ORAL_TABLET | ORAL | 0 refills | Status: DC
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
## 2020-08-10 NOTE — ED Triage Notes (Signed)
Pt presents with c/o cough and and sore throat for past few days

## 2020-08-10 NOTE — Discharge Instructions (Addendum)
I have sent in Augmentin for you to take twice a day for 7 days.  I have sent in fluconazole in case of yeast. Take one tablet at the onset of symptoms. If symptoms are still present in 3 days, take the second tablet.   Follow up with this office or with primary care if symptoms are persisting.  Follow up in the ER for high fever, trouble swallowing, trouble breathing, other concerning symptoms.  

## 2021-11-09 ENCOUNTER — Institutional Professional Consult (permissible substitution): Payer: Self-pay | Admitting: Plastic Surgery

## 2022-12-29 NOTE — Progress Notes (Signed)
 Tobacco Use: Low Risk  (12/29/2022)   Patient History   . Smoking Tobacco Use: Never   . Smokeless Tobacco Use: Never   . Passive Exposure: Never   Depression: Not at risk (12/29/2022)   PHQ-2   . PHQ-2 Score: 0

## 2022-12-29 NOTE — Progress Notes (Signed)
 Name:  Sandra Oliver DOB: October 01, 1980 Date: 12/31/2022  Subjective:  Sandra Oliver is a 42 y.o. female here for sore throat.   Chief Complaint  Patient presents with  . Sore Throat    Began last week when neighbor brought horses over across the road.  Slowly gotten worse- white spots on right side of throat  . Cough  . Nasal Congestion    Sandra Oliver 42 y.o. female who [facility] to [facility] for assessment of nasal congestion, slight cough and throat irritation. Symptoms have developed over the past week. No fever/chills, wheezing/dyspnea or known ill contacts. Feels symptoms are related to recent exposure to her neighbors' horses. States she has typically had allergic response to horse dander.     The following portions of the patient's history were reviewed and updated as appropriate: allergies, current medications, past family history, past medical history, past social history, past surgical history, and problem list.   Review of Systems:    Review of systems negative unless otherwise stated in HPI or ROS below.  Review of Systems  All other systems reviewed and are negative.   Objective:   Physical Exam Vitals reviewed.  Constitutional:      General: She is not in acute distress.    Appearance: Normal appearance. She is not ill-appearing.  HENT:     Head: Normocephalic and atraumatic.     Right Ear: Tympanic membrane, ear canal and external ear normal.     Left Ear: Tympanic membrane, ear canal and external ear normal.     Nose: Nose normal.     Mouth/Throat:     Lips: Pink. No lesions.     Mouth: Mucous membranes are moist. No oral lesions.     Tongue: No lesions.     Pharynx: Oropharynx is clear. Uvula midline. No pharyngeal swelling, oropharyngeal exudate, posterior oropharyngeal erythema, uvula swelling or postnasal drip.     Tonsils: No tonsillar exudate or tonsillar abscesses.  Eyes:     Conjunctiva/sclera: Conjunctivae normal.  Cardiovascular:     Rate and Rhythm:  Normal rate and regular rhythm.  Pulmonary:     Effort: Pulmonary effort is normal.     Breath sounds: Normal breath sounds.  Musculoskeletal:        General: Normal range of motion.     Cervical back: Normal range of motion and neck supple.  Lymphadenopathy:     Cervical: No cervical adenopathy.  Skin:    General: Skin is warm and dry.     Findings: No rash.  Neurological:     General: No focal deficit present.     Mental Status: She is alert and oriented to person, place, and time.     Vitals:   12/29/22 1727  BP: 124/80  BP Site: L Arm  BP Position: Sitting  BP Cuff Size: Large  Pulse: 64  Resp: 16  Temp: 36.5 C (97.7 F)  TempSrc: Tympanic  SpO2: 99%  Weight: 77.7 kg (171 lb 6.4 oz)  Height: 162.6 cm (5' 4)     Assessment/Plan:   Diagnoses and all orders for this visit:  Throat pain -     POCT Rapid Strep A Screen - RN Obtain -     Cancel: Group A Strep Culture -     Group A Strep Culture  Suspected COVID-19 virus infection -     POCT SARS/FLU/RSV - RN OBTAIN  Other orders -     etonogestrel (NEXPLANON) 68 mg Impl; Inject 1 each (68 mg  total) under the skin. -     POCT Rapid Strep A -     POCT SARS/Flu/RSV -     levocetirizine (XYZAL) 5 MG tablet; Take 1 tablet (5 mg total) by mouth every evening.   Office Visit on 12/29/2022  Component Date Value Ref Range Status  . Rapid Strep A Screen 12/29/2022 Negative  Negative Final  . POCT SARS-CoV-2 NAA 12/29/2022 Negative  Negative Final  . Influenza A 12/29/2022 Negative  Negative Final  . Influenza B 12/29/2022 Negative  Negative Final  . POC Rapid RSV, NAA 12/29/2022 Negative  Negative Final  . Operator ID 12/29/2022 Gretta Cower   Final     Plan/Patient Instructions: Your rapid strep test was negative today and we have sent off a routine throat culture. It will take 3-5 days for us  to receive the throat culture result. We will contact you and call in the appropriate antibiotics if your culture comes  back positive for an infection requiring antibiotic treatment. Please make sure we have a valid working phone number before you leave today. If you were given a prescription for antibiotics, you may want to wait and fill it until you know the results of the culture. Please use Tylenol  and/or Ibuprofen  as directed on the packing for pain.  Make sure you drink plenty of fluids and rest as needed.  Some people find salt water gargles and Traditional Medicinal's Throat Coat tea helpful. If symptoms do not improve or worsen over the next 3-5 days, you may need to return so we can test you for mononucleosis. If you are a student who attends school, you may return to school when you are 24 hours without fever. You should also follow up with your primary care provider.   Disposition Upon Discharge: Stable for at home care.   1.  Routine symptom specific, illness specific and/or disease specific instructions were discussed with the patient and/or caregiver at length. See diagnosis specific instructions below.   2.  The differential diagnosis for the patient's specific symptoms were reviewed and discussed with the patient and/or caregiver at length. The patient/caregiver expressed understanding of all discussed and their relevant questions were all satisfactorily answered.   3.  Return to care advised should the presenting symptoms recur, persist, or worsen in any way; or in the alternative, if new symptoms or complaints develop.   4.  The patient and any family present were given verbal and/or written discharge instructions as clinically indicated and appropriate.  5. Primary care provider or medical subspecialty follow up discussed.   Delon LITTIE Windle Willy, PA, PA-C

## 2023-05-13 NOTE — Progress Notes (Signed)
 Patient encounter reviewed and agree with plan.   Sandra Oliver

## 2023-09-28 ENCOUNTER — Emergency Department (HOSPITAL_BASED_OUTPATIENT_CLINIC_OR_DEPARTMENT_OTHER)

## 2023-09-28 ENCOUNTER — Other Ambulatory Visit: Payer: Self-pay

## 2023-09-28 ENCOUNTER — Emergency Department (HOSPITAL_BASED_OUTPATIENT_CLINIC_OR_DEPARTMENT_OTHER)
Admission: EM | Admit: 2023-09-28 | Discharge: 2023-09-28 | Disposition: A | Discharge status: Home / Self Care | Attending: Emergency Medicine | Admitting: Emergency Medicine

## 2023-09-28 ENCOUNTER — Encounter (HOSPITAL_BASED_OUTPATIENT_CLINIC_OR_DEPARTMENT_OTHER): Payer: Self-pay

## 2023-09-28 DIAGNOSIS — M545 Low back pain, unspecified: Secondary | ICD-10-CM | POA: Diagnosis present

## 2023-09-28 DIAGNOSIS — M5441 Lumbago with sciatica, right side: Secondary | ICD-10-CM | POA: Insufficient documentation

## 2023-09-28 DIAGNOSIS — M5431 Sciatica, right side: Secondary | ICD-10-CM

## 2023-09-28 LAB — BASIC METABOLIC PANEL WITH GFR
Anion gap: 13 (ref 5–15)
BUN: 16 mg/dL (ref 6–20)
CO2: 21 mmol/L — ABNORMAL LOW (ref 22–32)
Calcium: 9.3 mg/dL (ref 8.9–10.3)
Chloride: 107 mmol/L (ref 98–111)
Creatinine, Ser: 0.84 mg/dL (ref 0.44–1.00)
GFR, Estimated: >60 mL/min (ref >60)
Glucose, Bld: 93 mg/dL (ref 70–99)
Potassium: 3.6 mmol/L (ref 3.5–5.1)
Sodium: 141 mmol/L (ref 135–145)

## 2023-09-28 LAB — CBC WITH DIFFERENTIAL/PLATELET
Abs Immature Granulocytes: 0.02 K/uL (ref 0.00–0.07)
Basophils Absolute: 0.1 K/uL (ref 0.0–0.1)
Basophils Relative: 1 %
Eosinophils Absolute: 0.1 K/uL (ref 0.0–0.5)
Eosinophils Relative: 2 %
HCT: 38.3 % (ref 36.0–46.0)
Hemoglobin: 13.1 g/dL (ref 12.0–15.0)
Immature Granulocytes: 0 %
Lymphocytes Relative: 44 %
Lymphs Abs: 3.7 K/uL (ref 0.7–4.0)
MCH: 31 pg (ref 26.0–34.0)
MCHC: 34.2 g/dL (ref 30.0–36.0)
MCV: 90.5 fL (ref 80.0–100.0)
Monocytes Absolute: 0.4 K/uL (ref 0.1–1.0)
Monocytes Relative: 5 %
Neutro Abs: 4.1 K/uL (ref 1.7–7.7)
Neutrophils Relative %: 48 %
Platelets: 281 K/uL (ref 150–400)
RBC: 4.23 MIL/uL (ref 3.87–5.11)
RDW: 12.1 % (ref 11.5–15.5)
WBC: 8.4 K/uL (ref 4.0–10.5)
nRBC: 0 % (ref 0.0–0.2)

## 2023-09-28 LAB — URINALYSIS, W/ REFLEX TO CULTURE (INFECTION SUSPECTED)
Bacteria, UA: NONE SEEN
Bilirubin Urine: NEGATIVE
Glucose, UA: NEGATIVE mg/dL
Ketones, ur: NEGATIVE mg/dL
Nitrite: NEGATIVE
Protein, ur: NEGATIVE mg/dL
Specific Gravity, Urine: 1.014 (ref 1.005–1.030)
pH: 5.5 (ref 5.0–8.0)

## 2023-09-28 LAB — PREGNANCY, URINE: Preg Test, Ur: NEGATIVE

## 2023-09-28 MED ORDER — FAMOTIDINE 40 MG PO TABS: 40.0000 mg | ORAL_TABLET | Freq: Every day | ORAL | 0 refills | Status: DC

## 2023-09-28 MED ORDER — METHYLPREDNISOLONE 4 MG PO TBPK: ORAL_TABLET | ORAL | 0 refills | Status: DC

## 2023-09-28 MED ORDER — ACETAMINOPHEN 500 MG PO TABS
1000.0000 mg | ORAL_TABLET | Freq: Once | ORAL | Status: AC
Start: 2023-09-28 — End: 2023-09-28
  Administered 2023-09-28: 1000 mg via ORAL
  Filled 2023-09-28: qty 2

## 2023-09-28 MED ORDER — METHOCARBAMOL 500 MG PO TABS: 500.0000 mg | ORAL_TABLET | Freq: Two times a day (BID) | ORAL | 0 refills | Status: AC

## 2023-09-28 MED ORDER — OXYCODONE HCL 5 MG PO TABS
5.0000 mg | ORAL_TABLET | Freq: Once | ORAL | Status: AC
  Administered 2023-09-28: 5 mg via ORAL
  Filled 2023-09-28: qty 1

## 2023-09-28 MED ORDER — KETOROLAC TROMETHAMINE 15 MG/ML IJ SOLN
15.0000 mg | Freq: Once | INTRAMUSCULAR | Status: AC
  Administered 2023-09-28: 15 mg via INTRAVENOUS
  Filled 2023-09-28: qty 1

## 2023-09-28 MED ORDER — LIDOCAINE 5 % EX PTCH
1.0000 | MEDICATED_PATCH | CUTANEOUS | Status: DC
  Administered 2023-09-28: 1 via TRANSDERMAL
  Filled 2023-09-28: qty 1

## 2023-09-28 MED ORDER — METHOCARBAMOL 500 MG PO TABS: 500.0000 mg | ORAL_TABLET | Freq: Two times a day (BID) | ORAL | 0 refills | Status: DC

## 2023-09-28 MED ORDER — METHOCARBAMOL 500 MG PO TABS
500.0000 mg | ORAL_TABLET | Freq: Once | ORAL | Status: AC
  Administered 2023-09-28: 500 mg via ORAL
  Filled 2023-09-28: qty 1

## 2023-09-28 NOTE — Discharge Instructions (Addendum)
 Your blood work today was normal.  Your urine does not show an obvious infection, but we have sent it for urine culture.  You may take the steroid Dosepak as instructed by the dose packaging.  You can take Robaxin  as needed for back pain.  Do not take if you are planning to drive.  Do not take NSAIDs like ibuprofen , Motrin , Aleve  or naproxen  while you are taking steroids.  Take 1000 mg of Tylenol  each day.
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
## 2023-09-28 NOTE — ED Triage Notes (Signed)
 Arrives POV with complaints of low back x3 weeks. Patient reports that the pain is now an 8/10 and radiating down her right leg. No falls or injuries.

## 2023-09-30 LAB — URINE CULTURE

## 2023-11-27 ENCOUNTER — Telehealth: Payer: Self-pay | Admitting: Physician Assistant

## 2023-11-27 NOTE — Telephone Encounter (Signed)
 Please call patient and offer sooner new patient appointment with me.   She is requesting to see me and I have limited slots available.  Please call and offer sooner appointment with me, may override session limits however, needs to be 40 minutes.  Sandra Oliver

## 2023-12-06 ENCOUNTER — Ambulatory Visit: Admitting: Physician Assistant

## 2023-12-06 ENCOUNTER — Encounter: Payer: Self-pay | Admitting: Physician Assistant

## 2023-12-06 VITALS — BP 100/70 | HR 78 | Temp 97.7°F | Ht 63.0 in | Wt 161.5 lb

## 2023-12-06 DIAGNOSIS — G8929 Other chronic pain: Secondary | ICD-10-CM | POA: Diagnosis not present

## 2023-12-06 DIAGNOSIS — I872 Venous insufficiency (chronic) (peripheral): Secondary | ICD-10-CM

## 2023-12-06 DIAGNOSIS — M5441 Lumbago with sciatica, right side: Secondary | ICD-10-CM

## 2023-12-06 DIAGNOSIS — R002 Palpitations: Secondary | ICD-10-CM | POA: Diagnosis not present

## 2023-12-06 DIAGNOSIS — B009 Herpesviral infection, unspecified: Secondary | ICD-10-CM | POA: Insufficient documentation

## 2023-12-06 DIAGNOSIS — Z8669 Personal history of other diseases of the nervous system and sense organs: Secondary | ICD-10-CM | POA: Insufficient documentation

## 2023-12-06 DIAGNOSIS — Z8249 Family history of ischemic heart disease and other diseases of the circulatory system: Secondary | ICD-10-CM

## 2023-12-06 LAB — COMPREHENSIVE METABOLIC PANEL WITH GFR
ALT: 11 U/L (ref 0–35)
AST: 16 U/L (ref 0–37)
Albumin: 4.1 g/dL (ref 3.5–5.2)
Alkaline Phosphatase: 44 U/L (ref 39–117)
BUN: 11 mg/dL (ref 6–23)
CO2: 27 meq/L (ref 19–32)
Calcium: 8.8 mg/dL (ref 8.4–10.5)
Chloride: 105 meq/L (ref 96–112)
Creatinine, Ser: 0.71 mg/dL (ref 0.40–1.20)
GFR: 105.86 mL/min (ref >60.00)
Glucose, Bld: 68 mg/dL — ABNORMAL LOW (ref 70–99)
Potassium: 4 meq/L (ref 3.5–5.1)
Sodium: 137 meq/L (ref 135–145)
Total Bilirubin: 0.5 mg/dL (ref 0.2–1.2)
Total Protein: 6.8 g/dL (ref 6.0–8.3)

## 2023-12-06 LAB — CBC WITH DIFFERENTIAL/PLATELET
Basophils Absolute: 0 K/uL (ref 0.0–0.1)
Basophils Relative: 0.7 % (ref 0.0–3.0)
Eosinophils Absolute: 0.1 K/uL (ref 0.0–0.7)
Eosinophils Relative: 1.7 % (ref 0.0–5.0)
HCT: 39.8 % (ref 36.0–46.0)
Hemoglobin: 13.1 g/dL (ref 12.0–15.0)
Lymphocytes Relative: 32.2 % (ref 12.0–46.0)
Lymphs Abs: 2 K/uL (ref 0.7–4.0)
MCHC: 33 g/dL (ref 30.0–36.0)
MCV: 92.5 fl (ref 78.0–100.0)
Monocytes Absolute: 0.4 K/uL (ref 0.1–1.0)
Monocytes Relative: 5.8 % (ref 3.0–12.0)
Neutro Abs: 3.7 K/uL (ref 1.4–7.7)
Neutrophils Relative %: 59.6 % (ref 43.0–77.0)
Platelets: 287 K/uL (ref 150.0–400.0)
RBC: 4.3 Mil/uL (ref 3.87–5.11)
RDW: 12.4 % (ref 11.5–15.5)
WBC: 6.2 K/uL (ref 4.0–10.5)

## 2023-12-06 LAB — IBC + FERRITIN
Ferritin: 20.1 ng/mL (ref 10.0–291.0)
Iron: 84 ug/dL (ref 42–145)
Saturation Ratios: 28.6 % (ref 20.0–50.0)
TIBC: 294 ug/dL (ref 250.0–450.0)
Transferrin: 210 mg/dL — ABNORMAL LOW (ref 212.0–360.0)

## 2023-12-06 LAB — LIPID PANEL
Cholesterol: 188 mg/dL (ref 0–200)
HDL: 53.2 mg/dL (ref >39.00)
LDL Cholesterol: 119 mg/dL — ABNORMAL HIGH (ref 0–99)
NonHDL: 134.7
Total CHOL/HDL Ratio: 4
Triglycerides: 80 mg/dL (ref 0.0–149.0)
VLDL: 16 mg/dL (ref 0.0–40.0)

## 2023-12-06 LAB — HEMOGLOBIN A1C: Hgb A1c MFr Bld: 5.1 % (ref 4.6–6.5)

## 2023-12-06 LAB — TSH: TSH: 0.82 u[IU]/mL (ref 0.35–5.50)

## 2023-12-06 NOTE — Patient Instructions (Signed)
 It was great to see you!  We will complete blood work today  A coronary CT calcium scan is a computed tomography scan of the heart for the assessment of severity of coronary artery disease. Specifically, it looks for calcium deposits in atherosclerotic plaques in the coronary arteries that can narrow arteries and increase the risk of heart attack. This is $99 with [facility] currently. We will order this for [facility].  I will refer to cardiology as well and sports medicine.  Let's follow-up in 3-6  months for Comprehensive Physical Exam (CPE) preventive care annual visit, sooner if you have concerns.  Take care,  Lucie Buttner PA-C
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
## 2023-12-09 ENCOUNTER — Ambulatory Visit: Payer: Self-pay | Admitting: Physician Assistant

## 2023-12-12 ENCOUNTER — Encounter: Payer: Self-pay | Admitting: Physician Assistant

## 2023-12-12 NOTE — Progress Notes (Unsigned)
 Ben Jackson D.[facility] [address] [place] [address] Phone: [phone]   Assessment and Plan:     1. Chronic bilateral low back pain with right-sided sciatica (Primary) -Chronic with exacerbation, initial sports medicine visit - Most consistent with lumbar herniated disc leading to low back pain and right sided radicular symptoms present for 2+ years, but worsening symptoms over the past 3 to 4 weeks - Patient had an episode with immediate onset of back pain and right lower extremity weakness when lifting kettle bell 2 years ago.  This likely indicates an event that caused a herniated disc based on HPI - Recommend further evaluation with lumbar MRI to evaluate for disc herniation based on HPI, unremarkable CT lumbar spine, failure to improve despite >6 weeks of conservative therapy that has included chiropractic care, home exercises, intermittent medication use - Start meloxicam  15 mg daily x2 weeks.  If still having pain after 2 weeks, complete 3rd-week of NSAID. May use remaining NSAID as needed once daily for pain control.  Do not to use additional over-the-counter NSAIDs (ibuprofen , naproxen , Advil , Aleve , etc.) while taking prescription NSAIDs.  May use Tylenol  [phone] mg 2 to 3 times a day for breakthrough pain. -Start HEP for low back and core  15 additional minutes spent for educating Therapeutic Home Exercise Program.  This included exercises focusing on stretching, strengthening, with focus on eccentric aspects.   Long term goals include an improvement in range of motion, strength, endurance as well as avoiding reinjury. Patient's frequency would include in 1-2 times a day, 3-5 times a week for a duration of 6-12 weeks. Proper technique shown and discussed handout in great detail with ATC.  All questions were discussed and answered.      Pertinent previous records reviewed include CT lumbar spine 09/28/2023   Follow Up: 1 week after  MRI to review results and discuss treatment plan.  Could consider epidural if appropriate based on imaging   Subjective:   I, Tykeria Wawrzyniak, am serving as a Neurosurgeon for Doctor Morene Mace  Chief Complaint: low back pain   HPI:   12/13/2023 Patient is a 43 year old female with low back pain. Patient states pain started 2 years ago. She was carrying a kettle bell turned and had pain. She was seen by chiro. She had a flare 3 weeks ago. Ibu for the pain and that helps intermittently. Pain radiates up and down the back and is only on the right side. Decreased ROM    Relevant Historical Information: None pertinent  Additional pertinent review of systems negative.   Current Outpatient Medications:    meloxicam  (MOBIC ) 15 MG tablet, Take 1 tablet daily for 2 weeks.  If still in pain after 2 weeks, take 1 tablet daily for an additional 1 week., Disp: 30 tablet, Rfl: 0   etonogestrel (NEXPLANON) 68 MG IMPL implant, 1 each by Subdermal route once. March 2024, Disp: , Rfl:    methocarbamol  (ROBAXIN ) 500 MG tablet, Take 1 tablet (500 mg total) by mouth 2 (two) times daily., Disp: 20 tablet, Rfl: 0   SEMAGLUTIDE, 2 MG/DOSE, [place], Inject 2 mg into the skin every 21 ( twenty-one) days., Disp: , Rfl:    Objective:     Vitals:   12/13/23 1436  BP: 118/72  Weight: 161 lb (73 kg)  Height: 5' 3 (1.6 m)      Body mass index is 28.52 kg/m.    Physical Exam:  Gen: Appears well, nad, nontoxic and pleasant Psych: Alert and oriented, appropriate mood and affect Neuro: sensation intact, strength is 5/5 in upper and lower extremities, muscle tone wnl Skin: no susupicious lesions or rashes  Back - Normal skin, Spine with normal alignment and no deformity.   No tenderness to vertebral process palpation.   Right paraspinous muscles are   tender and with spasm NTTP gluteal musculature Straight leg raise positive right Trendelenberg positive right Piriformis Test negative Gait normal     Electronically signed by:  Odis Mace D.[facility] 3:01 PM 12/13/23

## 2023-12-13 ENCOUNTER — Ambulatory Visit (INDEPENDENT_AMBULATORY_CARE_PROVIDER_SITE_OTHER): Admitting: Sports Medicine

## 2023-12-13 ENCOUNTER — Other Ambulatory Visit: Payer: Self-pay | Admitting: Physician Assistant

## 2023-12-13 VITALS — BP 118/72 | Ht 63.0 in | Wt 161.0 lb

## 2023-12-13 DIAGNOSIS — M5441 Lumbago with sciatica, right side: Secondary | ICD-10-CM | POA: Diagnosis not present

## 2023-12-13 DIAGNOSIS — G8929 Other chronic pain: Secondary | ICD-10-CM | POA: Diagnosis not present

## 2023-12-13 MED ORDER — MELOXICAM 15 MG PO TABS: ORAL_TABLET | ORAL | 0 refills | Status: DC

## 2023-12-13 NOTE — Patient Instructions (Signed)
 MRI referral   Low back HEP   - Start meloxicam  15 mg daily x2 weeks.  If still having pain after 2 weeks, complete 3rd-week of NSAID. May use remaining NSAID as needed once daily for pain control.  Do not to use additional over-the-counter NSAIDs (ibuprofen , naproxen , Advil , Aleve , etc.) while taking prescription NSAIDs.  May use Tylenol  [phone] mg 2 to 3 times a day for breakthrough pain.  Follow up 1 week after MRI to discuss results

## 2023-12-16 NOTE — Addendum Note (Signed)
 Addended by: GEROME ASHLEY SAUNDERS on: 12/16/2023 08:48 AM   Modules accepted: Orders

## 2023-12-23 ENCOUNTER — Telehealth: Payer: Self-pay | Admitting: Sports Medicine

## 2023-12-23 ENCOUNTER — Ambulatory Visit: Admitting: Family

## 2023-12-23 NOTE — Telephone Encounter (Signed)
 Patient called and stated that getting an MRI at [facility] radiology is out of network for her. She asked that we send the referral somewhere that is in network for her. Please advise.

## 2023-12-27 ENCOUNTER — Ambulatory Visit (HOSPITAL_BASED_OUTPATIENT_CLINIC_OR_DEPARTMENT_OTHER)
Admission: RE | Admit: 2023-12-27 | Discharge: 2023-12-27 | Disposition: A | Discharge status: Home / Self Care | Payer: Self-pay | Source: Ambulatory Visit | Attending: Physician Assistant | Admitting: Physician Assistant

## 2023-12-27 DIAGNOSIS — Z8249 Family history of ischemic heart disease and other diseases of the circulatory system: Secondary | ICD-10-CM

## 2023-12-30 ENCOUNTER — Telehealth: Payer: Self-pay | Admitting: Sports Medicine

## 2023-12-30 NOTE — Telephone Encounter (Signed)
 Received a call from [place] at [employer] in regards to the appeal that was submmitted for the patient's MRI. She said they are not able to process the appeal due to missing information. She can be reached at [phone]. REF # [id number]

## 2024-01-01 NOTE — Telephone Encounter (Signed)
 Called number and left a voicemail to call me back. Just routing to you all so you are aware!

## 2024-02-07 ENCOUNTER — Ambulatory Visit (HOSPITAL_BASED_OUTPATIENT_CLINIC_OR_DEPARTMENT_OTHER)
Admission: RE | Admit: 2024-02-07 | Discharge: 2024-02-07 | Disposition: A | Discharge status: Home / Self Care | Source: Ambulatory Visit | Attending: Sports Medicine | Admitting: Sports Medicine

## 2024-02-07 DIAGNOSIS — G8929 Other chronic pain: Secondary | ICD-10-CM | POA: Insufficient documentation

## 2024-02-07 DIAGNOSIS — M5441 Lumbago with sciatica, right side: Secondary | ICD-10-CM | POA: Insufficient documentation

## 2024-02-10 ENCOUNTER — Other Ambulatory Visit: Payer: Self-pay | Admitting: Sports Medicine

## 2024-02-10 ENCOUNTER — Telehealth: Payer: Self-pay | Admitting: Sports Medicine

## 2024-02-10 MED ORDER — MELOXICAM 15 MG PO TABS: ORAL_TABLET | ORAL | 0 refills | Status: AC

## 2024-02-10 NOTE — Telephone Encounter (Signed)
 Refill placed

## 2024-02-10 NOTE — Telephone Encounter (Signed)
 Patient called requesting a refill on: meloxicam  (MOBIC ) 15 MG tablet   Pharmacy: [facility] [place]

## 2024-02-24 NOTE — Progress Notes (Signed)
 "               [facility] [address] [place] [address] Phone: [phone]   Assessment and Plan:     1. Chronic bilateral low back pain with right-sided sciatica (Primary) 2. Lumbar disc herniation -Chronic with exacerbation, subsequent visit - Continued low back pain with right-sided radicular symptoms present for 2+ years consistent with disc herniation at L3-L4 with lumbar radiculopathy - Recommend epidural CSI to right sided L3-L4 - Use meloxicam  15 mg daily as needed for breakthrough pain.  Recommend limiting chronic NSAIDs to 1-2 doses per week to prevent long-term side effects. Use Tylenol  500 to 1000 mg tablets 2-3 times a day as needed for day-to-day pain relief.    - Continue HEP for low back and core    Pertinent previous records reviewed include lumbar MRI 02/07/2024 IMPRESSION: 1. Right foraminal disc protrusion at L3-4, contacting the exiting right L3 nerve root. Query right L3 radiculitis. 2. Small left subarticular to foraminal disc protrusion at L4-5, potentially affecting either the left L4 or descending L5 nerve roots. Mild bilateral lateral recess and foraminal stenosis at this level. 3. Mild to moderate right-sided facet arthrosis at L4-5 and L5-S1. Finding could contribute to lower back pain.   Follow Up: 2 weeks after epidural to review benefit and discuss treatment plan.  Could consider additional epidural if necessary   Subjective:   I, Moenique Parris, am serving as a neurosurgeon for Doctor Morene Mace   Chief Complaint: low back pain    HPI:    12/13/2023 Patient is a 44 year old female with low back pain. Patient states pain started 2 years ago. She was carrying a kettle bell turned and had pain. She was seen by chiro. She had a flare 3 weeks ago. Ibu for the pain and that helps intermittently. Pain radiates up and down the back and is only on the right side. Decreased ROM    02/25/2024 Patient states  she is the same   Relevant Historical Information: None pertinent  Additional pertinent review of systems negative.  Current Medications[1]   Objective:     Vitals:   02/25/24 1454  Pulse: 76  SpO2: 98%  Weight: 172 lb (78 kg)  Height: 5' 3 (1.6 m)      Body mass index is 30.47 kg/m.    Physical Exam:    Gen: Appears well, nad, nontoxic and pleasant Psych: Alert and oriented, appropriate mood and affect Neuro: sensation intact, strength is 5/5 in upper and lower extremities, muscle tone wnl Skin: no susupicious lesions or rashes   Back - Normal skin, Spine with normal alignment and no deformity.   No tenderness to vertebral process palpation.   Right paraspinous muscles are   tender and with spasm NTTP gluteal musculature Straight leg raise positive right Trendelenberg positive right Piriformis Test negative Gait normal       Electronically signed by:  S[facility] 3:08 PM 02/25/2024     [1]  Current Outpatient Medications:    etonogestrel (NEXPLANON) 68 MG IMPL implant, 1 each by Subdermal route once. March 2024, Disp: , Rfl:    meloxicam  (MOBIC ) 15 MG tablet, Take 1 tablet daily for 2 weeks.  If still in pain after 2 weeks, take 1 tablet daily for an additional 1 week., Disp: 30 tablet, Rfl: 0   methocarbamol  (ROBAXIN ) 500 MG tablet, Take 1 tablet (500 mg total)  by mouth 2 (two) times daily., Disp: 20 tablet, Rfl: 0   SEMAGLUTIDE, 2 MG/DOSE, [place], Inject 2 mg into the skin every 21 ( twenty-one) days., Disp: , Rfl:   "

## 2024-02-25 ENCOUNTER — Ambulatory Visit: Admitting: Sports Medicine

## 2024-02-25 VITALS — HR 76 | Ht 63.0 in | Wt 172.0 lb

## 2024-02-25 DIAGNOSIS — M5126 Other intervertebral disc displacement, lumbar region: Secondary | ICD-10-CM

## 2024-02-25 DIAGNOSIS — M5441 Lumbago with sciatica, right side: Secondary | ICD-10-CM

## 2024-02-25 DIAGNOSIS — G8929 Other chronic pain: Secondary | ICD-10-CM

## 2024-02-25 NOTE — Patient Instructions (Signed)
 Epidural right L3-4  Follow up 2 week after to discuss

## 2024-03-01 NOTE — Progress Notes (Signed)
 " Cardiology Office Note:    Date:  03/13/2024   ID:  Sandra Oliver, DOB 1980/10/20, MRN [id number]  PCP:  Job Lukes, PA   [facility] Providers Cardiologist:  Shelda Bruckner, MD     Referring MD: Job Lukes, [place]   Chief Complaint  Patient presents with   New Patient (Initial Visit)    Palpitations, family history of premature heart disease    History of Present Illness:    Sandra Oliver is a 44 y.o. female with a hx of migraine headaches, venous insufficiency. She has a family history of cardiovascular disease and self-referred to cardiology.   Coronary calcium score 12/2023 was zero, no CAD.  She reports palpitations monthly, hasn't happened since before Christmas, but when they occur they can last for 2 hrs. No syncope.  She was seen for this in the ER in 2017.  She does wear a smart watch with Afib detection turned on, watch alerts to elevated HR but not Afib.   She does not drink alcohol. She does not smoke cigarettes. No THC containing products, no illicit drugs.   Father died of MI at age 34. Paternal grandmother had CABG. Uncle has heart issues.   Lives at home with two sons and brother - sons are 56 yo and 42 yo.  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   She recently hurt her back and is pending a back injection.  She has not been exercising routinely but is excited to get back to the gym after back injection.   Past Medical History:  Diagnosis Date   Headache(784.0)    Imitrex RX - last migraine 09/2010   Kidney infection 2006   hospitalized w/kidney infection during 2nd trimester of pregnancy   Low back pain 2013   tx with ibuprofen /epidurals/PT   Migraine    hx of topamax (didnt work); prn imitrex   Seasonal allergies    Vaginal Pap smear, abnormal     Past Surgical History:  Procedure Laterality Date   ABDOMINOPLASTY  06/07/2022   BREAST ENHANCEMENT SURGERY Bilateral 06/07/2022   COLPOSCOPY   03/23/2011   FOOT ARTHRODESIS, MODIFIED [facility]BRIDE     FOOT SURGERY  02/20/1999   bunion removal - left foot   VAGINAL DELIVERY     x 3    Current Medications: Active Medications[1]   Allergies:   Patient has no known allergies.   Social History   Socioeconomic History   Marital status: Single    Spouse name: Not on file   Number of children: Not on file   Years of education: Not on file   Highest education level: Not on file  Occupational History   Not on file  Tobacco Use   Smoking status: Never   Smokeless tobacco: Never  Vaping Use   Vaping status: Never Used  Substance and Sexual Activity   Alcohol use: Yes    Comment: rare; social   Drug use: No   Sexual activity: Yes    Birth control/protection: Implant    Comment: Nexplanon  Other Topics Concern   Not on file  Social History Narrative   Works at [employer]    3 children    2 boys; 1 daughter Fransisca)   Social Drivers of Health   Tobacco Use: Low Risk (03/13/2024)   Patient History    Smoking Tobacco Use: Never    Smokeless Tobacco Use: Never    Passive Exposure:  Not on file  Financial Resource Strain: Not on file  Food Insecurity: Not on file  Transportation Needs: Not on file  Physical Activity: Not on file  Stress: Not on file  Social Connections: Not on file  Depression (PHQ2-9): Low Risk (12/06/2023)   Depression (PHQ2-9)    PHQ-2 Score: 0  Alcohol Screen: Not on file  Housing: Not on file  Utilities: Not on file  Health Literacy: Not on file     Family History: The patient's family history includes Breast cancer in her maternal grandmother and another family member; Diabetes in her paternal grandmother; Heart attack (age of onset: 2) in her father; Heart disease in her paternal grandfather, paternal grandmother, and paternal uncle; Hypercholesterolemia in her mother; Hypertension in her mother; Mental retardation in her paternal aunt; Obesity in her father; Stroke in her paternal  grandfather.  ROS:   Please see the history of present illness.     All other systems reviewed and are negative.  EKGs/Labs/Other Studies Reviewed:    The following studies were reviewed today:  EKG Interpretation Date/Time:  Friday March 13 2024 08:44:02 EST Ventricular Rate:  64 PR Interval:  128 QRS Duration:  86 QT Interval:  400 QTC Calculation: 412 R Axis:   46  Text Interpretation: Normal sinus rhythm Normal ECG When compared with ECG of 13-Nov-2015 22:10, No significant change was found Confirmed by Madie Slough ([id number]) on 03/13/2024 9:05:29 AM    Recent Labs: 12/06/2023: ALT 11; BUN 11; Creatinine, Ser 0.71; Hemoglobin 13.1; Platelets 287.0; Potassium 4.0; Sodium 137; TSH 0.82  Recent Lipid Panel    Component Value Date/Time   CHOL 188 12/06/2023 1151   TRIG 80.0 12/06/2023 1151   HDL 53.20 12/06/2023 1151   CHOLHDL 4 12/06/2023 1151   VLDL 16.0 12/06/2023 1151   LDLCALC 119 (H) 12/06/2023 1151     Risk Assessment/Calculations:                Physical Exam:    VS:  BP 128/76   Pulse 64   Ht 5' 3 (1.6 m)   Wt 168 lb (76.2 kg)   SpO2 98%   BMI 29.76 kg/m     Wt Readings from Last 3 Encounters:  03/13/24 168 lb (76.2 kg)  02/25/24 172 lb (78 kg)  12/13/23 161 lb (73 kg)     GEN:  Well nourished, well developed in no acute distress HEENT: Normal NECK: No JVD; No carotid bruits LYMPHATICS: No lymphadenopathy CARDIAC: RRR, no murmurs, rubs, gallops RESPIRATORY:  Clear to auscultation without rales, wheezing or rhonchi  ABDOMEN: Soft, non-tender, non-distended MUSCULOSKELETAL:  No edema; No deformity  SKIN: Warm and dry NEUROLOGIC:  Alert and oriented x 3 PSYCHIATRIC:  Normal affect   ASSESSMENT:    1. Palpitations   2. Family history of cardiac disorder   3. Hyperlipidemia with target LDL less than 70   4. Venous insufficiency of right leg    PLAN:    In order of problems listed above:  Palpitations - Palpitations sound consistent  with an SVT - We discussed vagal maneuvers - Unfortunately palpitations are monthly or every other month and would not be captured on a 2-week monitor - We discussed obtaining a Kardia mobile to capture rapid heartbeats - I will also give 25 mg Lopressor as needed, cautioned against higher doses since resting heart rate is in the 60s - She will supplement with OTC magnesium tolerate 200 to 400 mg - Recent TSH WNL, will obtain  magnesium level today   Family history of CV disease -Reassuring coronary calcium score November 2025 - No chest pain   Hyperlipidemia with LDL less than 100 - LDL was 118 in 2025 - Will start 10 mg of Crestor given family history of premature heart disease - Will draw an LPA today   Venous insufficiency - recommended compression garments   She works near [employer] and wishes to follow there.  I will request a 57-month follow-up appointment with Dr. Lonni.  Should she capture an SVT on Kardia mobile, can consider an echocardiogram at that time.            Medication Adjustments/Labs and Tests Ordered: Current medicines are reviewed at length with the patient today.  Concerns regarding medicines are outlined above.  Orders Placed This Encounter  Procedures   Lipoprotein A (LPA)   Magnesium   EKG 12-Lead   Meds ordered this encounter  Medications   metoprolol tartrate (LOPRESSOR) 25 MG tablet    Sig: Take 1 tablet by mouth daily as needed for palpitations or rapid hear beat.    Dispense:  30 tablet    Refill:  1   rosuvastatin (CRESTOR) 10 MG tablet    Sig: Take 1 tablet (10 mg total) by mouth daily.    Dispense:  90 tablet    Refill:  0    Patient Instructions  Medication Instructions:  Your physician has recommended you make the following change in your medication:   START Rosuvastatin 10 mg taking 1 at night   START Lopressor 25 mg taking 1 daily only as needed for palpitations or rapid hear beat  *If you need a refill  on your cardiac medications before your next appointment, please call your pharmacy*  Lab Work: TODAY:  LPa & MAGNESIUM  If you have labs (blood work) drawn today and your tests are completely normal, you will receive your results only by: MyChart Message (if you have MyChart) OR A paper copy in the mail If you have any lab test that is abnormal or we need to change your treatment, we will call you to review the results.  Testing/Procedures: None ordered today   Follow-Up: At [facility], you and your health needs are our priority.  As part of our continuing mission to provide you with exceptional heart care, our providers are all part of one team.  This team includes your primary Cardiologist (physician) and Advanced Practice Providers or APPs (Physician Assistants and Nurse Practitioners) who all work together to provide you with the care you need, when you need it.  Your next appointment:   3 month(s)  Provider:   Shelda Lonni, MD    We recommend signing up for the patient portal called MyChart.  Sign up information is provided on this After Visit Summary.  MyChart is used to connect with patients for Virtual Visits (Telemedicine).  Patients are able to view lab/test results, encounter notes, upcoming appointments, etc.  Non-urgent messages can be sent to your provider as well.   To learn more about what you can do with MyChart, go to [email or web address].   Other Instructions Look up Vagal Maneuvers               Signed, Jon Nat Hails, [place]  03/13/2024 9:42 AM    [facility]     [1]  Current Meds  Medication Sig   etonogestrel (NEXPLANON) 68 MG IMPL implant 1 each by Subdermal route once. March 2024  meloxicam  (MOBIC ) 15 MG tablet Take 1 tablet daily for 2 weeks.  If still in pain after 2 weeks, take 1 tablet daily for an additional 1 week.   methocarbamol  (ROBAXIN ) 500 MG tablet Take 1 tablet (500 mg total) by mouth 2 (two)  times daily.   metoprolol tartrate (LOPRESSOR) 25 MG tablet Take 1 tablet by mouth daily as needed for palpitations or rapid hear beat.   rosuvastatin (CRESTOR) 10 MG tablet Take 1 tablet (10 mg total) by mouth daily.   SEMAGLUTIDE, 2 MG/DOSE, [place] Inject 2 mg into the skin every 21 ( twenty-one) days.   "

## 2024-03-13 ENCOUNTER — Telehealth: Payer: Self-pay | Admitting: Sports Medicine

## 2024-03-13 ENCOUNTER — Ambulatory Visit: Attending: Physician Assistant | Admitting: Physician Assistant

## 2024-03-13 ENCOUNTER — Other Ambulatory Visit (HOSPITAL_BASED_OUTPATIENT_CLINIC_OR_DEPARTMENT_OTHER): Payer: Self-pay

## 2024-03-13 ENCOUNTER — Encounter: Payer: Self-pay | Admitting: Physician Assistant

## 2024-03-13 VITALS — BP 128/76 | HR 64 | Ht 63.0 in | Wt 168.0 lb

## 2024-03-13 DIAGNOSIS — I872 Venous insufficiency (chronic) (peripheral): Secondary | ICD-10-CM

## 2024-03-13 DIAGNOSIS — E785 Hyperlipidemia, unspecified: Secondary | ICD-10-CM | POA: Diagnosis not present

## 2024-03-13 DIAGNOSIS — Z8249 Family history of ischemic heart disease and other diseases of the circulatory system: Secondary | ICD-10-CM

## 2024-03-13 DIAGNOSIS — R002 Palpitations: Secondary | ICD-10-CM

## 2024-03-13 MED ORDER — METOPROLOL TARTRATE 25 MG PO TABS
ORAL_TABLET | ORAL | 1 refills | Status: AC
  Filled 2024-03-13: qty 30, 30d supply, fill #0

## 2024-03-13 MED ORDER — ROSUVASTATIN CALCIUM 10 MG PO TABS
10.0000 mg | ORAL_TABLET | Freq: Every day | ORAL | 0 refills | Status: AC
  Filled 2024-03-13: qty 90, 90d supply, fill #0

## 2024-03-13 NOTE — Telephone Encounter (Signed)
 Pt called inquiring about status of epidural. [facility] does not take her insurance, so looks like it was sent to [facility]. She has not heard from anyone, unsure if [facility] with do the auth for this service. Please advise status or maybe a contact # for her to call.

## 2024-03-13 NOTE — Patient Instructions (Signed)
 Medication Instructions:  Your physician has recommended you make the following change in your medication:   START Rosuvastatin 10 mg taking 1 at night   START Lopressor 25 mg taking 1 daily only as needed for palpitations or rapid hear beat  *If you need a refill on your cardiac medications before your next appointment, please call your pharmacy*  Lab Work: TODAY:  LPa & MAGNESIUM  If you have labs (blood work) drawn today and your tests are completely normal, you will receive your results only by: MyChart Message (if you have MyChart) OR A paper copy in the mail If you have any lab test that is abnormal or we need to change your treatment, we will call you to review the results.  Testing/Procedures: None ordered today   Follow-Up: At [facility], you and your health needs are our priority.  As part of our continuing mission to provide you with exceptional heart care, our providers are all part of one team.  This team includes your primary Cardiologist (physician) and Advanced Practice Providers or APPs (Physician Assistants and Nurse Practitioners) who all work together to provide you with the care you need, when you need it.  Your next appointment:   3 month(s)  Provider:   Shelda Bruckner, MD    We recommend signing up for the patient portal called MyChart.  Sign up information is provided on this After Visit Summary.  MyChart is used to connect with patients for Virtual Visits (Telemedicine).  Patients are able to view lab/test results, encounter notes, upcoming appointments, etc.  Non-urgent messages can be sent to your provider as well.   To learn more about what you can do with MyChart, go to [email or web address].   Other Instructions Look up Vagal Maneuvers

## 2024-03-17 LAB — LIPOPROTEIN A (LPA): Lipoprotein (a): 27.9 nmol/L

## 2024-03-17 LAB — MAGNESIUM: Magnesium: 2.3 mg/dL (ref 1.6–2.3)

## 2024-03-19 ENCOUNTER — Ambulatory Visit: Payer: Self-pay | Admitting: Physician Assistant

## 2024-03-20 ENCOUNTER — Ambulatory Visit: Admitting: Physician Assistant

## 2024-03-20 ENCOUNTER — Encounter: Payer: Self-pay | Admitting: Physician Assistant

## 2024-03-20 VITALS — BP 110/80 | HR 81 | Temp 98.1°F | Ht 63.0 in | Wt 167.4 lb

## 2024-03-20 DIAGNOSIS — J020 Streptococcal pharyngitis: Secondary | ICD-10-CM

## 2024-03-20 LAB — POC COVID19 BINAXNOW: SARS Coronavirus 2 Ag: NEGATIVE

## 2024-03-20 LAB — POCT RAPID STREP A (OFFICE): Rapid Strep A Screen: POSITIVE — AB

## 2024-03-20 MED ORDER — FLUCONAZOLE 150 MG PO TABS: ORAL_TABLET | ORAL | 0 refills | Status: AC

## 2024-03-20 MED ORDER — PREDNISONE 20 MG PO TABS: 20.0000 mg | ORAL_TABLET | Freq: Two times a day (BID) | ORAL | 0 refills | Status: AC

## 2024-03-20 MED ORDER — AMOXICILLIN 500 MG PO CAPS: 500.0000 mg | ORAL_CAPSULE | Freq: Two times a day (BID) | ORAL | 0 refills | Status: AC

## 2024-03-20 MED ORDER — HYDROCOD POLI-CHLORPHE POLI ER 10-8 MG/5ML PO SUER: 5.0000 mL | Freq: Every evening | ORAL | 0 refills | Status: AC | PRN

## 2024-03-20 NOTE — Progress Notes (Signed)
 "  History of Present Illness:   Chief Complaint  Patient presents with   Sore Throat    Pt c/o sore throat x 2 days and now has a deep dry cough, non-productive.    Discussed the use of AI scribe software for clinical note transcription with the patient, who gave verbal consent to proceed.  History of Present Illness   Sandra Oliver is a 44 year old female who presents with a sore throat and positive strep test.  Symptoms started 2 days ago with severe throat pain, worse on one side, and a deep cough that began last night. She has tried Aleve  D, Alka-Seltzer Cold and Flu, Mucinex, and Emergen-C without relief. She has no drug allergies but often develops vaginal yeast infections when taking antibiotics.      Reports there is no fever.  Past Medical History:  Diagnosis Date   Headache(784.0)    Imitrex RX - last migraine 09/2010   Kidney infection 2006   hospitalized w/kidney infection during 2nd trimester of pregnancy   Low back pain 2013   tx with ibuprofen /epidurals/PT   Migraine    hx of topamax (didnt work); prn imitrex   Seasonal allergies    Vaginal Pap smear, abnormal      Social History[1]  Past Surgical History:  Procedure Laterality Date   ABDOMINOPLASTY  06/07/2022   BREAST ENHANCEMENT SURGERY Bilateral 06/07/2022   COLPOSCOPY  03/23/2011   FOOT ARTHRODESIS, MODIFIED [facility]BRIDE     FOOT SURGERY  02/20/1999   bunion removal - left foot   VAGINAL DELIVERY     x 3    Family History  Problem Relation Age of Onset   Hypertension Mother    Hypercholesterolemia Mother    Heart attack Father 65   Obesity Father    Breast cancer Maternal Grandmother    Heart disease Paternal Grandmother    Diabetes Paternal Grandmother    Heart disease Paternal Grandfather    Stroke Paternal Grandfather    Mental retardation Paternal Aunt        downs syndrome   Heart disease Paternal Uncle    Breast cancer Other     Allergies[2]  Current Medications:  Current  Medications[3]   Review of Systems:   Negative unless otherwise specified per HPI.  Vitals:   Vitals:   03/20/24 1032  BP: 110/80  Pulse: 81  Temp: 98.1 F (36.7 C)  TempSrc: Temporal  SpO2: 98%  Weight: 167 lb 6.1 oz (75.9 kg)  Height: 5' 3 (1.6 m)     Body mass index is 29.65 kg/m.  Physical Exam:   Physical Exam Vitals and nursing note reviewed.  Constitutional:      General: She is not in acute distress.    Appearance: She is well-developed. She is not ill-appearing or toxic-appearing.  HENT:     Head: Normocephalic and atraumatic.     Right Ear: Tympanic membrane, ear canal and external ear normal. Tympanic membrane is not erythematous, retracted or bulging.     Left Ear: Tympanic membrane, ear canal and external ear normal. Tympanic membrane is not erythematous, retracted or bulging.     Nose: Nose normal.     Right Sinus: No maxillary sinus tenderness or frontal sinus tenderness.     Left Sinus: No maxillary sinus tenderness or frontal sinus tenderness.     Mouth/Throat:     Pharynx: Uvula midline. Posterior oropharyngeal erythema present.     Tonsils: No tonsillar exudate. 1+ on the  right. 1+ on the left.  Eyes:     General: Lids are normal.     Conjunctiva/sclera: Conjunctivae normal.  Neck:     Trachea: Trachea normal.  Cardiovascular:     Rate and Rhythm: Normal rate and regular rhythm.     Pulses: Normal pulses.     Heart sounds: Normal heart sounds, S1 normal and S2 normal.  Pulmonary:     Effort: Pulmonary effort is normal.     Breath sounds: Normal breath sounds. No decreased breath sounds, wheezing, rhonchi or rales.  Lymphadenopathy:     Cervical: No cervical adenopathy.  Skin:    General: Skin is warm and dry.  Neurological:     Mental Status: She is alert.     GCS: GCS eye subscore is 4. GCS verbal subscore is 5. GCS motor subscore is 6.  Psychiatric:        Speech: Speech normal.        Behavior: Behavior normal. Behavior is  cooperative.    Results for orders placed or performed in visit on 03/20/24  POCT rapid strep A  Result Value Ref Range   Rapid Strep A Screen Positive (A) Negative  POC COVID-19 BinaxNow  Result Value Ref Range   SARS Coronavirus 2 Ag Negative Negative     Assessment and Plan:   Assessment and Plan    Acute streptococcal pharyngitis Positive strep test with severe unilateral throat pain. Discussed potential for yeast infection with antibiotics. Prednisone  offered for severe symptoms with mood change caution. - Prescribed amoxicillin . - Prescribed codeine cough syrup. - Provided prednisone  for severe pain if no improvement. - Advised to discard toothbrush during and after treatment. - Instructed to inform contacts and avoid sharing drinks. - Advised to monitor symptoms and start prednisone  if no improvement.       Lucie Buttner, PA-C     [1]  Social History Tobacco Use   Smoking status: Never   Smokeless tobacco: Never  Vaping Use   Vaping status: Never Used  Substance Use Topics   Alcohol use: Yes    Comment: rare; social   Drug use: No  [2] No Known Allergies [3]  Current Outpatient Medications:    amoxicillin  (AMOXIL ) 500 MG capsule, Take 1 capsule (500 mg total) by mouth 2 (two) times daily for 10 days., Disp: 20 capsule, Rfl: 0   chlorpheniramine-HYDROcodone  (TUSSIONEX) 10-8 MG/5ML, Take 5 mLs by mouth at bedtime as needed., Disp: 115 mL, Rfl: 0   etonogestrel (NEXPLANON) 68 MG IMPL implant, 1 each by Subdermal route once. March 2024, Disp: , Rfl:    fluconazole  (DIFLUCAN ) 150 MG tablet, Take 1 tablet by mouth. If symptoms persist, may take an additional tablet in 3-5 days. May repeat for a total of 3 tablets., Disp: 3 tablet, Rfl: 0   meloxicam  (MOBIC ) 15 MG tablet, Take 1 tablet daily for 2 weeks.  If still in pain after 2 weeks, take 1 tablet daily for an additional 1 week., Disp: 30 tablet, Rfl: 0   methocarbamol  (ROBAXIN ) 500 MG tablet, Take 1 tablet  (500 mg total) by mouth 2 (two) times daily., Disp: 20 tablet, Rfl: 0   metoprolol  tartrate (LOPRESSOR ) 25 MG tablet, Take 1 tablet by mouth daily as needed for palpitations or rapid hear beat., Disp: 30 tablet, Rfl: 1   predniSONE  (DELTASONE ) 20 MG tablet, Take 1 tablet (20 mg total) by mouth 2 (two) times daily with a meal., Disp: 10 tablet, Rfl: 0   rosuvastatin  (CRESTOR )  10 MG tablet, Take 1 tablet (10 mg total) by mouth daily., Disp: 90 tablet, Rfl: 0   SEMAGLUTIDE, 2 MG/DOSE, [place], Inject 2 mg into the skin every 21 ( twenty-one) days., Disp: , Rfl:   "

## 2024-04-07 ENCOUNTER — Encounter: Admitting: Physician Assistant

## 2024-06-04 ENCOUNTER — Ambulatory Visit (HOSPITAL_BASED_OUTPATIENT_CLINIC_OR_DEPARTMENT_OTHER): Admitting: Cardiology
